# Patient Record
Sex: Male | Born: 1995 | Hispanic: No | Marital: Single | Smoking: Current some day smoker
Health system: Southern US, Community
[De-identification: ages and names within clinical notes are randomized; demographics above are authoritative.]

## PROBLEM LIST (undated history)

## (undated) DIAGNOSIS — W3400XA Accidental discharge from unspecified firearms or gun, initial encounter: Secondary | ICD-10-CM

## (undated) DIAGNOSIS — K219 Gastro-esophageal reflux disease without esophagitis: Secondary | ICD-10-CM

## (undated) DIAGNOSIS — F988 Other specified behavioral and emotional disorders with onset usually occurring in childhood and adolescence: Secondary | ICD-10-CM

## (undated) DIAGNOSIS — F909 Attention-deficit hyperactivity disorder, unspecified type: Secondary | ICD-10-CM

## (undated) DIAGNOSIS — F329 Major depressive disorder, single episode, unspecified: Secondary | ICD-10-CM

## (undated) DIAGNOSIS — F32A Depression, unspecified: Secondary | ICD-10-CM

## (undated) DIAGNOSIS — F419 Anxiety disorder, unspecified: Secondary | ICD-10-CM

## (undated) HISTORY — DX: Depression, unspecified: F32.A

## (undated) HISTORY — PX: COLOSTOMY: SHX63

## (undated) HISTORY — DX: Attention-deficit hyperactivity disorder, unspecified type: F90.9

## (undated) HISTORY — DX: Anxiety disorder, unspecified: F41.9

## (undated) HISTORY — DX: Major depressive disorder, single episode, unspecified: F32.9

---
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
## 2015-04-18 ENCOUNTER — Ambulatory Visit (HOSPITAL_COMMUNITY): Payer: Self-pay | Admitting: Psychiatry

## 2015-05-11 ENCOUNTER — Ambulatory Visit (INDEPENDENT_AMBULATORY_CARE_PROVIDER_SITE_OTHER): Payer: BLUE CROSS/BLUE SHIELD | Admitting: Psychiatry

## 2015-05-11 ENCOUNTER — Encounter (INDEPENDENT_AMBULATORY_CARE_PROVIDER_SITE_OTHER): Payer: Self-pay

## 2015-05-11 ENCOUNTER — Encounter (HOSPITAL_COMMUNITY): Payer: Self-pay | Admitting: Psychiatry

## 2015-05-11 VITALS — BP 120/78 | HR 93 | Ht 72.0 in | Wt 154.2 lb

## 2015-05-11 DIAGNOSIS — F331 Major depressive disorder, recurrent, moderate: Secondary | ICD-10-CM | POA: Diagnosis not present

## 2015-05-11 DIAGNOSIS — F909 Attention-deficit hyperactivity disorder, unspecified type: Secondary | ICD-10-CM | POA: Insufficient documentation

## 2015-05-11 DIAGNOSIS — F411 Generalized anxiety disorder: Secondary | ICD-10-CM | POA: Diagnosis not present

## 2015-05-11 DIAGNOSIS — F900 Attention-deficit hyperactivity disorder, predominantly inattentive type: Secondary | ICD-10-CM | POA: Diagnosis not present

## 2015-05-11 MED ORDER — AMPHETAMINE-DEXTROAMPHETAMINE 10 MG PO TABS: 10.0000 mg | ORAL_TABLET | Freq: Two times a day (BID) | ORAL | Status: DC
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
## 2015-05-25 ENCOUNTER — Encounter (HOSPITAL_COMMUNITY): Payer: Self-pay | Admitting: Psychiatry

## 2015-05-25 ENCOUNTER — Ambulatory Visit (INDEPENDENT_AMBULATORY_CARE_PROVIDER_SITE_OTHER): Payer: BLUE CROSS/BLUE SHIELD | Admitting: Psychiatry

## 2015-05-25 DIAGNOSIS — F900 Attention-deficit hyperactivity disorder, predominantly inattentive type: Secondary | ICD-10-CM | POA: Diagnosis not present

## 2015-05-25 MED ORDER — AMPHETAMINE-DEXTROAMPHET ER 20 MG PO CP24: 20.0000 mg | ORAL_CAPSULE | Freq: Two times a day (BID) | ORAL | Status: DC

## 2015-05-25 NOTE — Progress Notes (Signed)
BH MD/PA/NP OP Progress Note  05/25/2015 1:42 PM Nathan Vincent  MRN:  [id number]  Subjective:  Being treated for ADHD trial with depression and anxiety Chief Complaint: depression, anxiety and inability to focus Visit Diagnosis:  ADHD inattentive  Past Medical History: No past medical history on file. No past surgical history on file. Family History: No family history on file. Social History:  Social History   Social History  . Marital Status: Single    Spouse Name: N/A  . Number of Children: N/A  . Years of Education: N/A   Social History Main Topics  . Smoking status: Current Every Day Smoker -- 0.25 packs/day for 6 years    Types: Cigarettes  . Smokeless tobacco: Never Used  . Alcohol Use: 0.0 oz/week    0 Standard drinks or equivalent per week     Comment: OCC  . Drug Use: Yes    Special: Marijuana  . Sexual Activity: Not Asked   Other Topics Concern  . None   Social History Narrative   Additional History: none  Assessment:Nathan Vincent says the 10 mg Adderall was very helpful for the first 2 weeks and not as noticeably helpful for the last 2.  He has been able to focus, organize, complete tasks, hold his thoughts while somebody else is speaking and be able to recall what he wanted to say.  Has not felt depressed and anxiety has greatly decreased.  He was not presenting as ADHD last visit, that was my thinking so he is very surprised by the change.  Says he feels motivated and people tell him he is easier to talk with.  Says he stopped drinking because he does not need the wine to help sleep and stop his racing thoughts.  Musculoskeletal: Strength & Muscle Tone: within normal limits Gait & Station: normal Patient leans: N/A  Psychiatric Specialty Exam: HPI  ROS  There were no vitals taken for this visit.There is no weight on file to calculate BMI.  General Appearance: Well Groomed  Eye Contact:  Good  Speech:  Clear and Coherent  Volume:  Normal  Mood:   Euthymic  Affect:  Congruent  Thought Process:  Coherent and Logical  Orientation:  Full (Time, Place, and Person)  Thought Content:  Negative  Suicidal Thoughts:  No  Homicidal Thoughts:  No  Memory:  Immediate;   Good Recent;   Good Remote;   Good  Judgement:  Good  Insight:  Good  Psychomotor Activity:  Normal  Concentration:  Good  Recall:  Good  Fund of Knowledge: Good  Language: Good  Akathisia:  Negative  Handed:  Right  AIMS (if indicated):  0  Assets:  Communication Skills Desire for Improvement Financial Resources/Insurance Housing Intimacy Leisure Time Physical Health Resilience Social Support Talents/Skills Transportation Vocational/Educational  ADL's:  Intact  Cognition: WNL  Sleep:  adequate   Is the patient at risk to self?  No. Has the patient been a risk to self in the past 6 months?  No. Has the patient been a risk to self within the distant past?  No. Is the patient a risk to others?  No. Has the patient been a risk to others in the past 6 months?  No. Has the patient been a risk to others within the distant past?  No.  Current Medications: Current Outpatient Prescriptions  Medication Sig Dispense Refill  . amphetamine-dextroamphetamine (ADDERALL XR) 20 MG 24 hr capsule Take 1 capsule (20 mg total) by mouth 2 (  two) times daily. 60 capsule 0  . amphetamine-dextroamphetamine (ADDERALL XR) 20 MG 24 hr capsule Take 1 capsule (20 mg total) by mouth 2 (two) times daily. 60 capsule 0  . amphetamine-dextroamphetamine (ADDERALL XR) 20 MG 24 hr capsule Take 1 capsule (20 mg total) by mouth 2 (two) times daily. 60 capsule 0   No current facility-administered medications for this visit.    Medical Decision Making:  Established Problem, Stable/Improving (1)  Treatment Plan Summary:Medication management and Plan increase Adderall to 20 mg bid.  return to clinic in 3 months   Donnelly Angelica 05/25/2015, 1:42 PM

## 2015-06-08 ENCOUNTER — Telehealth (HOSPITAL_COMMUNITY): Payer: Self-pay

## 2015-06-08 NOTE — Telephone Encounter (Signed)
Medication management - Fax received from [facility] for approval of patient's Adderall XR medication.  Case # [id number] approved from 06/08/15-06/07/16.

## 2015-06-08 NOTE — Telephone Encounter (Signed)
Patients father called me, the adderall needed a prior auth, I called the insurance and was able to get it done. I spoke with Georgann Housekeeper and the [place] # is [id number] good until 06/07/2016. I called patients father and let him know that they should be able to fill the rx now

## 2015-08-24 ENCOUNTER — Ambulatory Visit (INDEPENDENT_AMBULATORY_CARE_PROVIDER_SITE_OTHER): Payer: BLUE CROSS/BLUE SHIELD | Admitting: Psychiatry

## 2015-08-24 ENCOUNTER — Encounter (HOSPITAL_COMMUNITY): Payer: Self-pay | Admitting: Psychiatry

## 2015-08-24 VITALS — BP 90/64 | HR 94 | Ht 71.75 in | Wt 140.8 lb

## 2015-08-24 DIAGNOSIS — F331 Major depressive disorder, recurrent, moderate: Secondary | ICD-10-CM | POA: Diagnosis not present

## 2015-08-24 DIAGNOSIS — F900 Attention-deficit hyperactivity disorder, predominantly inattentive type: Secondary | ICD-10-CM

## 2015-08-24 DIAGNOSIS — F411 Generalized anxiety disorder: Secondary | ICD-10-CM | POA: Diagnosis not present

## 2015-08-24 MED ORDER — PAROXETINE HCL 20 MG PO TABS: 20.0000 mg | ORAL_TABLET | Freq: Every day | ORAL | Status: DC

## 2015-08-24 MED ORDER — AMPHETAMINE-DEXTROAMPHET ER 20 MG PO CP24: 20.0000 mg | ORAL_CAPSULE | Freq: Two times a day (BID) | ORAL | Status: DC
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
## 2015-09-28 ENCOUNTER — Encounter (HOSPITAL_COMMUNITY): Payer: Self-pay | Admitting: Emergency Medicine

## 2015-09-28 ENCOUNTER — Emergency Department (HOSPITAL_COMMUNITY)
Admission: EM | Admit: 2015-09-28 | Discharge: 2015-09-29 | Disposition: A | Discharge status: Home / Self Care | Payer: BLUE CROSS/BLUE SHIELD | Attending: Emergency Medicine | Admitting: Emergency Medicine

## 2015-09-28 DIAGNOSIS — F331 Major depressive disorder, recurrent, moderate: Secondary | ICD-10-CM | POA: Insufficient documentation

## 2015-09-28 DIAGNOSIS — Z79899 Other long term (current) drug therapy: Secondary | ICD-10-CM | POA: Insufficient documentation

## 2015-09-28 DIAGNOSIS — F909 Attention-deficit hyperactivity disorder, unspecified type: Secondary | ICD-10-CM | POA: Insufficient documentation

## 2015-09-28 DIAGNOSIS — R451 Restlessness and agitation: Secondary | ICD-10-CM | POA: Diagnosis present

## 2015-09-28 DIAGNOSIS — F17210 Nicotine dependence, cigarettes, uncomplicated: Secondary | ICD-10-CM | POA: Insufficient documentation

## 2015-09-28 LAB — COMPREHENSIVE METABOLIC PANEL
ALBUMIN: 5.2 g/dL — AB (ref 3.5–5.0)
ALK PHOS: 84 U/L (ref 38–126)
ALT: 18 U/L (ref 17–63)
AST: 27 U/L (ref 15–41)
Anion gap: 8 (ref 5–15)
BILIRUBIN TOTAL: 1 mg/dL (ref 0.3–1.2)
BUN: 8 mg/dL (ref 6–20)
CALCIUM: 9.6 mg/dL (ref 8.9–10.3)
CO2: 27 mmol/L (ref 22–32)
CREATININE: 0.93 mg/dL (ref 0.61–1.24)
Chloride: 102 mmol/L (ref 101–111)
GFR calc Af Amer: >60 mL/min (ref >60)
GFR calc non Af Amer: >60 mL/min (ref >60)
GLUCOSE: 99 mg/dL (ref 65–99)
Potassium: 3 mmol/L — ABNORMAL LOW (ref 3.5–5.1)
SODIUM: 137 mmol/L (ref 135–145)
TOTAL PROTEIN: 8.1 g/dL (ref 6.5–8.1)

## 2015-09-28 LAB — CBC
HEMATOCRIT: 45.1 % (ref 39.0–52.0)
HEMOGLOBIN: 15.6 g/dL (ref 13.0–17.0)
MCH: 28.6 pg (ref 26.0–34.0)
MCHC: 34.6 g/dL (ref 30.0–36.0)
MCV: 82.8 fL (ref 78.0–100.0)
Platelets: 327 10*3/uL (ref 150–400)
RBC: 5.45 MIL/uL (ref 4.22–5.81)
RDW: 13.3 % (ref 11.5–15.5)
WBC: 14 10*3/uL — ABNORMAL HIGH (ref 4.0–10.5)

## 2015-09-28 LAB — RAPID URINE DRUG SCREEN, HOSP PERFORMED
Amphetamines: NOT DETECTED
BARBITURATES: NOT DETECTED
Benzodiazepines: NOT DETECTED
COCAINE: NOT DETECTED
Opiates: NOT DETECTED
TETRAHYDROCANNABINOL: NOT DETECTED

## 2015-09-28 LAB — ACETAMINOPHEN LEVEL

## 2015-09-28 LAB — ETHANOL: Alcohol, Ethyl (B): <5 mg/dL (ref <5)

## 2015-09-28 LAB — SALICYLATE LEVEL: Salicylate Lvl: <4 mg/dL (ref 2.8–30.0)

## 2015-09-28 MED ORDER — PAROXETINE HCL 20 MG PO TABS
20.0000 mg | ORAL_TABLET | Freq: Every day | ORAL | Status: DC
Start: 2015-09-29 — End: 2015-09-29

## 2015-09-28 MED ORDER — AMPHETAMINE-DEXTROAMPHET ER 10 MG PO CP24
20.0000 mg | ORAL_CAPSULE | Freq: Two times a day (BID) | ORAL | Status: DC
  Administered 2015-09-28: 20 mg via ORAL
  Filled 2015-09-28: qty 2

## 2015-09-28 NOTE — BH Assessment (Signed)
Assessment completed. Consulted with Darlyne Russian, PA-C who states that patient does not meet criteria at this time. Will speak with EDP regarding releasing patient from IVC or patient will be observed overnight per Darlyne Russian, PA-C.   Rosalin Hawking, LCSW Therapeutic Triage Specialist [place] 09/28/2015 11:16 PM
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
## 2015-09-28 NOTE — BH Assessment (Signed)
Spoke with patient regarding discharge. Advised patient that it was recommended that he follow up with Dr. Doyne Keel tomorrow. Advised patient to call [phone] to request a hospital follow up appointment. Patient reports that he has a safe place to go to tonight and will call Dr. Havery Moros office tomorrow to see if he can move his appointment up.   Rosalin Hawking, LCSW Therapeutic Triage Specialist [place] 09/28/2015 11:59 PM

## 2015-09-28 NOTE — Discharge Instructions (Signed)
Major Depressive Disorder Major depressive disorder is a mental illness. It also may be called clinical depression or unipolar depression. Major depressive disorder usually causes feelings of sadness, hopelessness, or helplessness. Some people with this disorder do not feel particularly sad but lose interest in doing things they used to enjoy (anhedonia). Major depressive disorder also can cause physical symptoms. It can interfere with work, school, relationships, and other normal everyday activities. The disorder varies in severity but is longer lasting and more serious than the sadness we all feel from time to time in our lives. Major depressive disorder often is triggered by stressful life events or major life changes. Examples of these triggers include divorce, loss of your job or home, a move, and the death of a family member or close friend. Sometimes this disorder occurs for no obvious reason at all. People who have family members with major depressive disorder or bipolar disorder are at higher risk for developing this disorder, with or without life stressors. Major depressive disorder can occur at any age. It may occur just once in your life (single episode major depressive disorder). It may occur multiple times (recurrent major depressive disorder). SYMPTOMS People with major depressive disorder have either anhedonia or depressed mood on nearly a daily basis for at least 2 weeks or longer. Symptoms of depressed mood include:  Feelings of sadness (blue or down in the dumps) or emptiness.  Feelings of hopelessness or helplessness.  Tearfulness or episodes of crying (may be observed by others).  Irritability (children and adolescents). In addition to depressed mood or anhedonia or both, people with this disorder have at least four of the following symptoms:  Difficulty sleeping or sleeping too much.   Significant change (increase or decrease) in appetite or weight.   Lack of energy or  motivation.  Feelings of guilt and worthlessness.   Difficulty concentrating, remembering, or making decisions.  Unusually slow movement (psychomotor retardation) or restlessness (as observed by others).   Recurrent wishes for death, recurrent thoughts of self-harm (suicide), or a suicide attempt. People with major depressive disorder commonly have persistent negative thoughts about themselves, other people, and the world. People with severe major depressive disorder may experiencedistorted beliefs or perceptions about the world (psychotic delusions). They also may see or hear things that are not real (psychotic hallucinations). DIAGNOSIS Major depressive disorder is diagnosed through an assessment by your health care provider. Your health care provider will ask aboutaspects of your daily life, such as mood,sleep, and appetite, to see if you have the diagnostic symptoms of major depressive disorder. Your health care provider may ask about your medical history and use of alcohol or drugs, including prescription medicines. Your health care provider also may do a physical exam and blood work. This is because certain medical conditions and the use of certain substances can cause major depressive disorder-like symptoms (secondary depression). Your health care provider also may refer you to a mental health specialist for further evaluation and treatment. TREATMENT It is important to recognize the symptoms of major depressive disorder and seek treatment. The following treatments can be prescribed for this disorder:   Medicine. Antidepressant medicines usually are prescribed. Antidepressant medicines are thought to correct chemical imbalances in the brain that are commonly associated with major depressive disorder. Other types of medicine may be added if the symptoms do not respond to antidepressant medicines alone or if psychotic delusions or hallucinations occur.  Talk therapy. Talk therapy can be  helpful in treating major depressive disorder by providing   support, education, and guidance. Certain types of talk therapy also can help with negative thinking (cognitive behavioral therapy) and with relationship issues that trigger this disorder (interpersonal therapy). A mental health specialist can help determine which treatment is best for you. Most people with major depressive disorder do well with a combination of medicine and talk therapy. Treatments involving electrical stimulation of the brain can be used in situations with extremely severe symptoms or when medicine and talk therapy do not work over time. These treatments include electroconvulsive therapy, transcranial magnetic stimulation, and vagal nerve stimulation.   This information is not intended to replace advice given to you by your health care provider. Make sure you discuss any questions you have with your health care provider.   Document Released: 08/17/2012 Document Revised: 05/13/2014 Document Reviewed: 08/17/2012 Elsevier Interactive Patient Education 2016 [employer]  

## 2015-09-28 NOTE — ED Notes (Signed)
Bed: [facility] Expected date: 09/28/15 Expected time: 11:10 PM Means of arrival:  Comments: [facility] C
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
## 2015-10-01 ENCOUNTER — Encounter (HOSPITAL_COMMUNITY): Payer: Self-pay | Admitting: Emergency Medicine

## 2015-10-01 ENCOUNTER — Emergency Department (HOSPITAL_COMMUNITY)
Admission: EM | Admit: 2015-10-01 | Discharge: 2015-10-01 | Disposition: A | Discharge status: Home / Self Care | Payer: BLUE CROSS/BLUE SHIELD | Attending: Emergency Medicine | Admitting: Emergency Medicine

## 2015-10-01 DIAGNOSIS — R451 Restlessness and agitation: Secondary | ICD-10-CM | POA: Diagnosis not present

## 2015-10-01 DIAGNOSIS — F17210 Nicotine dependence, cigarettes, uncomplicated: Secondary | ICD-10-CM | POA: Diagnosis not present

## 2015-10-01 DIAGNOSIS — F909 Attention-deficit hyperactivity disorder, unspecified type: Secondary | ICD-10-CM | POA: Insufficient documentation

## 2015-10-01 DIAGNOSIS — F329 Major depressive disorder, single episode, unspecified: Secondary | ICD-10-CM | POA: Diagnosis present

## 2015-10-01 MED ORDER — LORAZEPAM 0.5 MG PO TABS: 0.5000 mg | ORAL_TABLET | Freq: Three times a day (TID) | ORAL | Status: DC | PRN

## 2015-10-01 NOTE — Discharge Instructions (Signed)
Please read and follow all provided instructions.  Your diagnoses today include:  1. Agitation     Tests performed today include:  Vital signs. See below for your results today.   Medications prescribed:   Ativan - medication for relaxation  DO NOT drive or perform any activities that require you to be awake and alert because this medicine can make you drowsy.   Home care instructions:  Follow any educational materials contained in this packet.  Follow-up instructions: Please follow-up with your psychiatrist in 2 days.  Return instructions:   Please return to the Emergency Department if you experience worsening symptoms.   Please return if you have any other emergent concerns.  Additional Information:  Your vital signs today were: BP 129/75 mmHg   Pulse 107   Temp(Src) 98 F (36.7 C) (Oral)   Resp 18   SpO2 100% If your blood pressure (BP) was elevated above 135/85 this visit, please have this repeated by your doctor within one month. ---------------

## 2015-10-01 NOTE — ED Notes (Signed)
Patient was alert, oriented and stable upon discharge. RN went over AVS and patient had no further questions.  

## 2015-10-01 NOTE — ED Provider Notes (Signed)
CSN: [id number]     Arrival date & time 10/01/15  2005 History   First MD Initiated Contact with Patient 10/01/15 2102     Chief Complaint  Patient presents with  . Depression     (Consider location/radiation/quality/duration/timing/severity/associated sxs/prior Treatment) HPI Comments: Patient with history of ADHD, anxiety, depression -- presents with complaint of agitation. Patient states he has had episodes of agitation over the past week. He states that he is currently on Adderall and Paxil. He started the Paxil approximately one month ago. Patient states that he ran out 6 days ago and after that began having agitation spells. He feels that he would be better off on just the Adderall and is requesting medication change. Patient denies hallucinations, suicidal ideation, homicidal ideation. He sees Dr. Doyne Keel at [facility] and has follow-up in 1 month. He has not attempted to contact behavioral health regarding his concerns.  The history is provided by the patient.    Past Medical History  Diagnosis Date  . ADHD (attention deficit hyperactivity disorder)   . Anxiety   . Depression    Past Surgical History  Procedure Laterality Date  . No past surgeries     Family History  Problem Relation Age of Onset  . Family history unknown: Yes   Social History  Substance Use Topics  . Smoking status: Current Every Day Smoker -- 0.25 packs/day for 6 years    Types: Cigarettes  . Smokeless tobacco: Never Used  . Alcohol Use: 0.0 oz/week    0 Standard drinks or equivalent per week     Comment: 2 beers a month    Review of Systems  Constitutional: Negative for fever.  HENT: Negative for rhinorrhea and sore throat.   Eyes: Negative for redness.  Respiratory: Negative for cough.   Cardiovascular: Negative for chest pain.  Gastrointestinal: Negative for nausea, vomiting, abdominal pain and diarrhea.  Genitourinary: Negative for dysuria.  Musculoskeletal: Negative for myalgias.  Skin:  Negative for rash.  Neurological: Negative for headaches.  Psychiatric/Behavioral: Positive for agitation. Negative for suicidal ideas and hallucinations. The patient is nervous/anxious.       Allergies  Review of patient's allergies indicates no known allergies.  Home Medications   Prior to Admission medications   Medication Sig Start Date End Date Taking? Authorizing Provider  amphetamine-dextroamphetamine (ADDERALL XR) 20 MG 24 hr capsule Take 1 capsule (20 mg total) by mouth 2 (two) times daily. 08/24/15 08/23/16  Charlcie Cradle, MD  PARoxetine (PAXIL) 20 MG tablet Take 1 tablet (20 mg total) by mouth daily. 08/24/15 08/23/16  Charlcie Cradle, MD   BP 129/75 mmHg  Pulse 107  Temp(Src) 98 F (36.7 C) (Oral)  Resp 18  SpO2 100% Physical Exam  Constitutional: He appears well-developed and well-nourished.  HENT:  Head: Normocephalic and atraumatic.  Eyes: Conjunctivae are normal.  Neck: Normal range of motion. Neck supple.  Pulmonary/Chest: No respiratory distress.  Neurological: He is alert.  Skin: Skin is warm and dry.  Psychiatric: He has a normal mood and affect. His behavior is normal. Thought content normal. He expresses no homicidal and no suicidal ideation.  Nursing note and vitals reviewed.   ED Course  Procedures (including critical care time)  10:19 PM Patient seen and examined.    Vital signs reviewed and are as follows: BP 129/75 mmHg  Pulse 107  Temp(Src) 98 F (36.7 C) (Oral)  Resp 18  SpO2 100%  Discussed with patient that I do not feel comfortable making adjustments to his  psychiatric medications. I encouraged him to follow-up with his psychiatrist. His increased agitation may be due to abrupt discontinuation of Paxil since he ran out 6 days ago. I provided #5 0.5mg  Ativan to use in case of increased agitation. Patient is to contact his psychiatrist to see if he can get follow-up sooner. Patient agrees with plan.  MDM   Final diagnoses:  Agitation    No SI, HI, hallucinations. No indication for TTS consult at this time.    Carlisle Cater, PA-C 10/01/15 2307  Leonard Schwartz, MD 10/13/15 0900

## 2015-10-01 NOTE — ED Notes (Signed)
PA at bedside.

## 2015-10-01 NOTE — ED Notes (Signed)
Pt states that he wants to get off his paxil and be switched back to his adderall. States that the Paxil has made him aggressive and he cannot come back to his parent's house until he is calm/stable. States that he drank alcohol with the paxil before and had an adverse reaction. Denies SI/HI. Alert and oriented.

## 2015-11-02 ENCOUNTER — Encounter (HOSPITAL_COMMUNITY): Payer: Self-pay | Admitting: Psychiatry

## 2015-11-02 ENCOUNTER — Ambulatory Visit (INDEPENDENT_AMBULATORY_CARE_PROVIDER_SITE_OTHER): Payer: BLUE CROSS/BLUE SHIELD | Admitting: Psychiatry

## 2015-11-02 ENCOUNTER — Telehealth (HOSPITAL_COMMUNITY): Payer: Self-pay

## 2015-11-02 VITALS — BP 120/68 | HR 97 | Ht 72.0 in | Wt 142.0 lb

## 2015-11-02 DIAGNOSIS — F331 Major depressive disorder, recurrent, moderate: Secondary | ICD-10-CM

## 2015-11-02 DIAGNOSIS — F900 Attention-deficit hyperactivity disorder, predominantly inattentive type: Secondary | ICD-10-CM

## 2015-11-02 DIAGNOSIS — F411 Generalized anxiety disorder: Secondary | ICD-10-CM | POA: Diagnosis not present

## 2015-11-02 MED ORDER — AMPHETAMINE-DEXTROAMPHET ER 20 MG PO CP24: 20.0000 mg | ORAL_CAPSULE | Freq: Two times a day (BID) | ORAL | Status: DC

## 2015-11-02 MED ORDER — BUSPIRONE HCL 5 MG PO TABS: 5.0000 mg | ORAL_TABLET | Freq: Two times a day (BID) | ORAL | Status: DC
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
## 2015-11-03 ENCOUNTER — Telehealth (HOSPITAL_COMMUNITY): Payer: Self-pay

## 2015-11-03 NOTE — Telephone Encounter (Signed)
11/03/15  12noon Pt's father came by and pick-up rx script and AVS..Mariana Kaufman

## 2015-12-07 ENCOUNTER — Telehealth (HOSPITAL_COMMUNITY): Payer: Self-pay

## 2015-12-07 ENCOUNTER — Other Ambulatory Visit (HOSPITAL_COMMUNITY): Payer: Self-pay | Admitting: Psychiatry

## 2015-12-07 DIAGNOSIS — F900 Attention-deficit hyperactivity disorder, predominantly inattentive type: Secondary | ICD-10-CM

## 2015-12-07 MED ORDER — AMPHETAMINE-DEXTROAMPHET ER 20 MG PO CP24: 20.0000 mg | ORAL_CAPSULE | Freq: Two times a day (BID) | ORAL | 0 refills | Status: DC

## 2015-12-07 NOTE — Telephone Encounter (Signed)
Patients father calling, patient needs a refill on his Adderall. Patient was here last month and only got one month supply. His follow up is in September, okay to refill for two months? Please review and advise, thank you

## 2016-01-06 ENCOUNTER — Other Ambulatory Visit (HOSPITAL_COMMUNITY): Payer: Self-pay | Admitting: Psychiatry

## 2016-01-06 DIAGNOSIS — F411 Generalized anxiety disorder: Secondary | ICD-10-CM

## 2016-01-11 ENCOUNTER — Ambulatory Visit (HOSPITAL_COMMUNITY): Payer: Self-pay | Admitting: Psychiatry

## 2016-01-11 ENCOUNTER — Other Ambulatory Visit (HOSPITAL_COMMUNITY): Payer: Self-pay

## 2016-01-11 ENCOUNTER — Other Ambulatory Visit (HOSPITAL_COMMUNITY): Payer: Self-pay | Admitting: Psychiatry

## 2016-01-11 DIAGNOSIS — F411 Generalized anxiety disorder: Secondary | ICD-10-CM

## 2016-01-11 DIAGNOSIS — F900 Attention-deficit hyperactivity disorder, predominantly inattentive type: Secondary | ICD-10-CM

## 2016-01-11 MED ORDER — BUSPIRONE HCL 5 MG PO TABS: 5.0000 mg | ORAL_TABLET | Freq: Two times a day (BID) | ORAL | 1 refills | Status: DC

## 2016-01-11 MED ORDER — AMPHETAMINE-DEXTROAMPHET ER 20 MG PO CP24: 20.0000 mg | ORAL_CAPSULE | Freq: Two times a day (BID) | ORAL | 0 refills | Status: DC

## 2016-01-11 NOTE — Progress Notes (Signed)
Patient missed appointment, came in for refills. Dr. Doyne Keel agreed to refill medication until his rescheduled follow up

## 2016-01-25 ENCOUNTER — Ambulatory Visit (INDEPENDENT_AMBULATORY_CARE_PROVIDER_SITE_OTHER): Payer: BLUE CROSS/BLUE SHIELD | Admitting: Psychiatry

## 2016-01-25 ENCOUNTER — Encounter (HOSPITAL_COMMUNITY): Payer: Self-pay | Admitting: Psychiatry

## 2016-01-25 VITALS — BP 118/78 | HR 94 | Ht 72.0 in | Wt 147.6 lb

## 2016-01-25 DIAGNOSIS — F331 Major depressive disorder, recurrent, moderate: Secondary | ICD-10-CM

## 2016-01-25 DIAGNOSIS — F900 Attention-deficit hyperactivity disorder, predominantly inattentive type: Secondary | ICD-10-CM

## 2016-01-25 DIAGNOSIS — F411 Generalized anxiety disorder: Secondary | ICD-10-CM | POA: Diagnosis not present

## 2016-01-25 MED ORDER — AMPHETAMINE-DEXTROAMPHET ER 20 MG PO CP24: 20.0000 mg | ORAL_CAPSULE | Freq: Two times a day (BID) | ORAL | 0 refills | Status: DC

## 2016-01-25 MED ORDER — SERTRALINE HCL 50 MG PO TABS: 50.0000 mg | ORAL_TABLET | Freq: Every day | ORAL | 3 refills | Status: DC
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
## 2016-05-09 ENCOUNTER — Ambulatory Visit (HOSPITAL_COMMUNITY): Payer: Self-pay | Admitting: Psychiatry

## 2016-05-13 ENCOUNTER — Other Ambulatory Visit (HOSPITAL_COMMUNITY): Payer: Self-pay

## 2016-05-13 DIAGNOSIS — F900 Attention-deficit hyperactivity disorder, predominantly inattentive type: Secondary | ICD-10-CM

## 2016-05-13 MED ORDER — AMPHETAMINE-DEXTROAMPHET ER 20 MG PO CP24: 20.0000 mg | ORAL_CAPSULE | Freq: Two times a day (BID) | ORAL | 0 refills | Status: DC

## 2016-05-13 NOTE — Progress Notes (Signed)
Patient stopped in for Adderall prescription, he missed his appointment last week. Dr. Adele Schilder agreed to write enough to get him to his appointment, 05/16/2016

## 2016-05-15 ENCOUNTER — Telehealth (HOSPITAL_COMMUNITY): Payer: Self-pay | Admitting: Psychiatry

## 2016-05-15 NOTE — Telephone Encounter (Signed)
Nathan Vincent picked up prescription on [id number] lic [id number] dlo

## 2016-05-16 ENCOUNTER — Encounter (HOSPITAL_COMMUNITY): Payer: Self-pay | Admitting: Psychiatry

## 2016-05-16 ENCOUNTER — Ambulatory Visit (INDEPENDENT_AMBULATORY_CARE_PROVIDER_SITE_OTHER): Payer: BLUE CROSS/BLUE SHIELD | Admitting: Psychiatry

## 2016-05-16 DIAGNOSIS — F17210 Nicotine dependence, cigarettes, uncomplicated: Secondary | ICD-10-CM

## 2016-05-16 DIAGNOSIS — Z79899 Other long term (current) drug therapy: Secondary | ICD-10-CM | POA: Diagnosis not present

## 2016-05-16 DIAGNOSIS — F900 Attention-deficit hyperactivity disorder, predominantly inattentive type: Secondary | ICD-10-CM

## 2016-05-16 MED ORDER — AMPHETAMINE-DEXTROAMPHET ER 20 MG PO CP24: 20.0000 mg | ORAL_CAPSULE | Freq: Two times a day (BID) | ORAL | 0 refills | Status: DC
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
## 2016-06-07 ENCOUNTER — Emergency Department (HOSPITAL_COMMUNITY)
Admission: EM | Admit: 2016-06-07 | Discharge: 2016-06-07 | Disposition: A | Discharge status: Home / Self Care | Payer: BLUE CROSS/BLUE SHIELD | Attending: Emergency Medicine | Admitting: Emergency Medicine

## 2016-06-07 ENCOUNTER — Encounter (HOSPITAL_COMMUNITY): Payer: Self-pay | Admitting: Emergency Medicine

## 2016-06-07 DIAGNOSIS — F909 Attention-deficit hyperactivity disorder, unspecified type: Secondary | ICD-10-CM | POA: Insufficient documentation

## 2016-06-07 DIAGNOSIS — S51812A Laceration without foreign body of left forearm, initial encounter: Secondary | ICD-10-CM | POA: Insufficient documentation

## 2016-06-07 DIAGNOSIS — Z7289 Other problems related to lifestyle: Secondary | ICD-10-CM

## 2016-06-07 DIAGNOSIS — Y999 Unspecified external cause status: Secondary | ICD-10-CM | POA: Diagnosis not present

## 2016-06-07 DIAGNOSIS — X788XXA Intentional self-harm by other sharp object, initial encounter: Secondary | ICD-10-CM | POA: Insufficient documentation

## 2016-06-07 DIAGNOSIS — Y929 Unspecified place or not applicable: Secondary | ICD-10-CM | POA: Insufficient documentation

## 2016-06-07 DIAGNOSIS — Z79899 Other long term (current) drug therapy: Secondary | ICD-10-CM | POA: Diagnosis not present

## 2016-06-07 DIAGNOSIS — Y939 Activity, unspecified: Secondary | ICD-10-CM | POA: Diagnosis not present

## 2016-06-07 DIAGNOSIS — F17210 Nicotine dependence, cigarettes, uncomplicated: Secondary | ICD-10-CM | POA: Diagnosis not present

## 2016-06-07 DIAGNOSIS — Z23 Encounter for immunization: Secondary | ICD-10-CM | POA: Diagnosis not present

## 2016-06-07 MED ORDER — TETANUS-DIPHTH-ACELL PERTUSSIS 5-2.5-18.5 LF-MCG/0.5 IM SUSP
0.5000 mL | Freq: Once | INTRAMUSCULAR | Status: AC
  Administered 2016-06-07: 0.5 mL via INTRAMUSCULAR
  Filled 2016-06-07: qty 0.5

## 2016-06-07 MED ORDER — LIDOCAINE-EPINEPHRINE (PF) 2 %-1:200000 IJ SOLN
20.0000 mL | Freq: Once | INTRAMUSCULAR | Status: AC
  Administered 2016-06-07: 20 mL
  Filled 2016-06-07: qty 20

## 2016-06-07 NOTE — ED Provider Notes (Signed)
[facility] DEPT Provider Note   CSN: [id number] Arrival date & time: 06/07/16  0301   By signing my name below, I, Delton Prairie, attest that this documentation has been prepared under the direction and in the presence of Orpah Greek, MD  Electronically Signed: Delton Prairie, ED Scribe. 06/07/16. 3:16 AM.   History   Chief Complaint Chief Complaint  Patient presents with  . Laceration   The history is provided by the patient. No language interpreter was used.   HPI Comments:  Nathan Vincent is a 21 y.o. male who presents to the Emergency Department complaining of acute onset laceration to his left forearm which occurred PTA. Pt states he cuts himself as a coping mechanism. Tetanus status is unknown. No alleviating factors noted. Pt denis suicidal ideation. No other associated symptoms noted.   Past Medical History:  Diagnosis Date  . ADHD (attention deficit hyperactivity disorder)   . Anxiety   . Depression     Patient Active Problem List   Diagnosis Date Noted  . Attention deficit hyperactivity disorder (ADHD) 05/11/2015    Class: Chronic  . Depression, major, recurrent, moderate ([place]) 05/11/2015    Class: Chronic  . Generalized anxiety disorder 05/11/2015    Class: Chronic    Past Surgical History:  Procedure Laterality Date  . NO PAST SURGERIES         Home Medications    Prior to Admission medications   Medication Sig Start Date End Date Taking? Authorizing Provider  amphetamine-dextroamphetamine (ADDERALL XR) 20 MG 24 hr capsule Take 1 capsule (20 mg total) by mouth 2 (two) times daily. 05/16/16 05/16/17  Charlcie Cradle, MD  amphetamine-dextroamphetamine (ADDERALL XR) 20 MG 24 hr capsule Take 1 capsule (20 mg total) by mouth 2 (two) times daily. 05/16/16 05/16/17  Charlcie Cradle, MD  amphetamine-dextroamphetamine (ADDERALL XR) 20 MG 24 hr capsule Take 1 capsule (20 mg total) by mouth 2 (two) times daily. 05/16/16 05/16/17  Charlcie Cradle, MD     Family History Family History  Problem Relation Age of Onset  . Family history unknown: Yes    Social History Social History  Substance Use Topics  . Smoking status: Current Some Day Smoker    Packs/day: 0.50    Years: 6.00    Types: Cigarettes  . Smokeless tobacco: Never Used  . Alcohol use 0.0 oz/week     Comment: occassionally     Allergies   Patient has no known allergies.   Review of Systems Review of Systems  Skin: Positive for wound.  Psychiatric/Behavioral: Negative for suicidal ideas.  All other systems reviewed and are negative.  Physical Exam Updated Vital Signs BP 118/80   Pulse 88   Temp 98.1 F (36.7 C) (Oral)   Resp 19   SpO2 100%   Physical Exam  Constitutional: He is oriented to person, place, and time. He appears well-developed and well-nourished. No distress.  HENT:  Head: Normocephalic and atraumatic.  Right Ear: Hearing normal.  Left Ear: Hearing normal.  Nose: Nose normal.  Mouth/Throat: Oropharynx is clear and moist and mucous membranes are normal.  Eyes: Conjunctivae and EOM are normal. Pupils are equal, round, and reactive to light.  Neck: Normal range of motion. Neck supple.  Cardiovascular: Regular rhythm, S1 normal and S2 normal.  Exam reveals no gallop and no friction rub.   No murmur heard. Pulmonary/Chest: Effort normal and breath sounds normal. No respiratory distress. He exhibits no tenderness.  Abdominal: Soft. Normal appearance and bowel sounds are  normal. There is no hepatosplenomegaly. There is no tenderness. There is no rebound, no guarding, no tenderness at McBurney's point and negative Murphy's sign. No hernia.  Musculoskeletal: Normal range of motion.  Neurological: He is alert and oriented to person, place, and time. He has normal strength. No cranial nerve deficit or sensory deficit. Coordination normal. GCS eye subscore is 4. GCS verbal subscore is 5. GCS motor subscore is 6.  Skin: Skin is warm, dry and intact.  No rash noted. No cyanosis.  8 cm laceration to left forearm.  Psychiatric: He has a normal mood and affect. His speech is normal and behavior is normal. Thought content normal.  Nursing note and vitals reviewed.  ED Treatments / Results  DIAGNOSTIC STUDIES:  Oxygen Saturation is 100% on RA, normal by my interpretation.    COORDINATION OF CARE:  3:14 AM Discussed treatment plan with pt at bedside and pt agreed to plan.  Labs (all labs ordered are listed, but only abnormal results are displayed) Labs Reviewed - No data to display  EKG  EKG Interpretation None       Radiology No results found.  Procedures Procedures (including critical care time)  Medications Ordered in ED Medications  lidocaine-EPINEPHrine (XYLOCAINE W/EPI) 2 %-1:200000 (PF) injection 20 mL (not administered)  Tdap (BOOSTRIX) injection 0.5 mL (0.5 mLs Intramuscular Given 06/07/16 0335)     Initial Impression / Assessment and Plan / ED Course  I have reviewed the triage vital signs and the nursing notes.  Pertinent labs & imaging results that were available during my care of the patient were reviewed by me and considered in my medical decision making (see chart for details).     Patient presents to the emergency department for evaluation of laceration to left forearm. Patient reports a long history of cutting behavior which he uses as a "coping mechanism". Patient reports that he was told that the laceration on his arm was deep enough to require stitches. This occurred just prior to arrival. He adamantly denies suicide attempt. Patient's arms are heavily scarred with similarly arranged scars from previous cutting behavior.  Patient is able to contract for safety. Lacerations repaired, will be given resources for outpatient counseling.  Final Clinical Impressions(s) / ED Diagnoses   Final diagnoses:  Laceration of left forearm, initial encounter  Deliberate self-cutting    New Prescriptions New  Prescriptions   No medications on file  I personally performed the services described in this documentation, which was scribed in my presence. The recorded information has been reviewed and is accurate.     Orpah Greek, MD 06/07/16 [phone]

## 2016-06-07 NOTE — ED Provider Notes (Signed)
LACERATION REPAIR Performed by: Lorayne Bender Authorized by: Lorayne Bender Consent: Verbal consent obtained. Risks and benefits: risks, benefits and alternatives were discussed Consent given by: patient Patient identity confirmed: provided demographic data Prepped and Draped in normal sterile fashion Wound explored  Laceration Location: Left forearm  Laceration Length: 13 cm  No Foreign Bodies seen or palpated  Anesthesia: local infiltration  Local anesthetic: lidocaine 2% with epinephrine  Anesthetic total: 8 ml  Irrigation method: syringe Amount of cleaning: standard  Skin closure: 3-0 prolene  Number of sutures: 15 total  Technique: Simple interrupted (11), Horizontal mattress (4)  Patient tolerance: Patient tolerated the procedure well with no immediate complications.  This was a more complex wound repair requiring undermining due to tension on the wound.   Lorayne Bender, PA-C 06/07/16 [place], PA-C 06/07/16 [place], MD 06/08/16 [phone]

## 2016-06-07 NOTE — Discharge Instructions (Signed)
Your sutures will need to be removed in 7-10 days. Follow-up with your primary doctor or at [facility] urgent care for suture removal.

## 2016-06-07 NOTE — ED Triage Notes (Signed)
Pt presents to ER for laceration to LEFT forearm; bleeding controlled; other scars from previous lacerations noted; pt denies this as a suicide attempt but as a stress reliever

## 2016-06-07 NOTE — ED Notes (Signed)
Wound now bleeding; covered with abd pad

## 2016-06-16 ENCOUNTER — Emergency Department (HOSPITAL_COMMUNITY)
Admission: EM | Admit: 2016-06-16 | Discharge: 2016-06-16 | Disposition: A | Discharge status: Home / Self Care | Payer: BLUE CROSS/BLUE SHIELD | Attending: Emergency Medicine | Admitting: Emergency Medicine

## 2016-06-16 ENCOUNTER — Encounter (HOSPITAL_COMMUNITY): Payer: Self-pay | Admitting: Emergency Medicine

## 2016-06-16 DIAGNOSIS — F909 Attention-deficit hyperactivity disorder, unspecified type: Secondary | ICD-10-CM | POA: Insufficient documentation

## 2016-06-16 DIAGNOSIS — Z79899 Other long term (current) drug therapy: Secondary | ICD-10-CM | POA: Insufficient documentation

## 2016-06-16 DIAGNOSIS — Z4802 Encounter for removal of sutures: Secondary | ICD-10-CM | POA: Diagnosis not present

## 2016-06-16 NOTE — ED Provider Notes (Signed)
[facility] DEPT Provider Note   CSN: [id number] Arrival date & time: 06/16/16  1244  By signing my name below, I, Nathan Vincent, attest that this documentation has been prepared under the direction and in the presence of [employer], PA-C.  Electronically Signed: Julien Vincent, ED Scribe. 06/16/16. 1:33 PM.    History   Chief Complaint Chief Complaint  Patient presents with  . Suture / Staple Removal   The history is provided by the patient. No language interpreter was used.   HPI Comments: Nathan Vincent is a 21 y.o. male who presents to the Emergency Department presenting for a suture removal. Pt has 15 sutures placed to the left forearm on 06/07/16 after cutting himself as a "coping mechanism". There has been no redness, warmth, or drainage from the area. He denies SI or HI.  Past Medical History:  Diagnosis Date  . ADHD (attention deficit hyperactivity disorder)   . Anxiety   . Depression     Patient Active Problem List   Diagnosis Date Noted  . Attention deficit hyperactivity disorder (ADHD) 05/11/2015    Class: Chronic  . Depression, major, recurrent, moderate ([place]) 05/11/2015    Class: Chronic  . Generalized anxiety disorder 05/11/2015    Class: Chronic    Past Surgical History:  Procedure Laterality Date  . NO PAST SURGERIES         Home Medications    Prior to Admission medications   Medication Sig Start Date End Date Taking? Authorizing Provider  amphetamine-dextroamphetamine (ADDERALL XR) 20 MG 24 hr capsule Take 1 capsule (20 mg total) by mouth 2 (two) times daily. 05/16/16 05/16/17  Charlcie Cradle, MD  amphetamine-dextroamphetamine (ADDERALL XR) 20 MG 24 hr capsule Take 1 capsule (20 mg total) by mouth 2 (two) times daily. 05/16/16 05/16/17  Charlcie Cradle, MD  amphetamine-dextroamphetamine (ADDERALL XR) 20 MG 24 hr capsule Take 1 capsule (20 mg total) by mouth 2 (two) times daily. 05/16/16 05/16/17  Charlcie Cradle, MD    Family  History Family History  Problem Relation Age of Onset  . Family history unknown: Yes    Social History Social History  Substance Use Topics  . Smoking status: Current Some Day Smoker    Packs/day: 0.50    Years: 6.00    Types: Cigarettes  . Smokeless tobacco: Never Used  . Alcohol use 0.0 oz/week     Comment: occassionally     Allergies   Patient has no known allergies.   Review of Systems Review of Systems  A complete 10 system review of systems was obtained and all systems are negative except as noted in the HPI and PMH.   Physical Exam Updated Vital Signs BP 112/74 (BP Location: Right Arm)   Pulse 93   Temp 98.4 F (36.9 C) (Oral)   Resp 16   Wt 63.5 kg   SpO2 97%   BMI 18.99 kg/m   Physical Exam  Constitutional: He is oriented to person, place, and time. He appears well-developed and well-nourished.  HENT:  Head: Normocephalic.  Eyes: EOM are normal.  Neck: Normal range of motion.  Pulmonary/Chest: Effort normal.  Abdominal: He exhibits no distension.  Musculoskeletal: Normal range of motion.  Neurological: He is alert and oriented to person, place, and time.  Skin:  Well healing incision, no signs of infection.  Psychiatric: He has a normal mood and affect.  Nursing note and vitals reviewed.    ED Treatments / Results  DIAGNOSTIC STUDIES: Oxygen Saturation is 100% on  RA, normal by my interpretation.  COORDINATION OF CARE:  1:30 PM Discussed treatment plan with pt at bedside and pt agreed to plan.  Labs (all labs ordered are listed, but only abnormal results are displayed) Labs Reviewed - No data to display  EKG  EKG Interpretation None       Radiology No results found.  Procedures .Suture Removal Date/Time: 06/16/2016 1:18 PM Performed by: Penni Bombard, Mena Simonis Authorized by: Penni Bombard, Tiffanie Blassingame   Consent:    Consent obtained:  Verbal   Consent given by:  Patient Location:    Location:  Upper extremity   Upper extremity location:   Arm   Arm location:  L lower arm Procedure details:    Wound appearance:  No signs of infection   Number of sutures removed:  15 Post-procedure details:    Post-removal:  No dressing applied   Patient tolerance of procedure:  Tolerated well, no immediate complications    (including critical care time)  Medications Ordered in ED Medications - No data to display   Initial Impression / Assessment and Plan / ED Course  I have reviewed the triage vital signs and the nursing notes.  Pertinent labs & imaging results that were available during my care of the patient were reviewed by me and considered in my medical decision making (see chart for details).       Final Clinical Impressions(s) / ED Diagnoses   Final diagnoses:  Visit for suture removal   Labs: n/a  Imaging: n/a  Consults: n/a  Therapeutics: n/a  Discharge Meds: n/a  Assessment/Plan:Laceration appearing well healing without signs of infection. Return precautions given.   I personally performed the services described in this documentation, which was scribed in my presence. The recorded information has been reviewed and is accurate.  New Prescriptions Discharge Medication List as of 06/16/2016  1:42 PM       Okey Regal, PA-C 06/16/16 [place], MD 06/16/16 [phone]

## 2016-06-16 NOTE — Discharge Instructions (Signed)
Please read attached information. If you experience any new or worsening signs or symptoms please return to the emergency room for evaluation.  °
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
## 2016-06-22 ENCOUNTER — Emergency Department (HOSPITAL_COMMUNITY): Payer: BLUE CROSS/BLUE SHIELD

## 2016-06-22 ENCOUNTER — Encounter (HOSPITAL_COMMUNITY): Admission: EM | Disposition: A | Discharge status: Home Health | Payer: Self-pay | Source: Home / Self Care

## 2016-06-22 ENCOUNTER — Emergency Department (HOSPITAL_COMMUNITY): Payer: BLUE CROSS/BLUE SHIELD | Admitting: Anesthesiology

## 2016-06-22 ENCOUNTER — Inpatient Hospital Stay (HOSPITAL_COMMUNITY)
Admission: EM | Admit: 2016-06-22 | Discharge: 2016-07-01 | DRG: 957 | Disposition: A | Discharge status: Home Health | Payer: BLUE CROSS/BLUE SHIELD | Attending: General Surgery | Admitting: General Surgery

## 2016-06-22 ENCOUNTER — Encounter (HOSPITAL_COMMUNITY): Payer: Self-pay | Admitting: Emergency Medicine

## 2016-06-22 DIAGNOSIS — S36490A Other injury of duodenum, initial encounter: Secondary | ICD-10-CM | POA: Diagnosis present

## 2016-06-22 DIAGNOSIS — Y249XXA Unspecified firearm discharge, undetermined intent, initial encounter: Secondary | ICD-10-CM | POA: Diagnosis not present

## 2016-06-22 DIAGNOSIS — S3719XA Other injury of ureter, initial encounter: Secondary | ICD-10-CM | POA: Diagnosis not present

## 2016-06-22 DIAGNOSIS — S31040A Puncture wound with foreign body of lower back and pelvis without penetration into retroperitoneum, initial encounter: Secondary | ICD-10-CM | POA: Diagnosis not present

## 2016-06-22 DIAGNOSIS — S36513A Primary blast injury of sigmoid colon, initial encounter: Secondary | ICD-10-CM | POA: Diagnosis not present

## 2016-06-22 DIAGNOSIS — J90 Pleural effusion, not elsewhere classified: Secondary | ICD-10-CM | POA: Diagnosis not present

## 2016-06-22 DIAGNOSIS — S36593A Other injury of sigmoid colon, initial encounter: Secondary | ICD-10-CM | POA: Diagnosis not present

## 2016-06-22 DIAGNOSIS — S32029A Unspecified fracture of second lumbar vertebra, initial encounter for closed fracture: Secondary | ICD-10-CM | POA: Diagnosis present

## 2016-06-22 DIAGNOSIS — F329 Major depressive disorder, single episode, unspecified: Secondary | ICD-10-CM | POA: Diagnosis present

## 2016-06-22 DIAGNOSIS — J029 Acute pharyngitis, unspecified: Secondary | ICD-10-CM | POA: Diagnosis not present

## 2016-06-22 DIAGNOSIS — R058 Other specified cough: Secondary | ICD-10-CM

## 2016-06-22 DIAGNOSIS — S31000A Unspecified open wound of lower back and pelvis without penetration into retroperitoneum, initial encounter: Secondary | ICD-10-CM | POA: Diagnosis not present

## 2016-06-22 DIAGNOSIS — S36410A Primary blast injury of duodenum, initial encounter: Secondary | ICD-10-CM | POA: Diagnosis not present

## 2016-06-22 DIAGNOSIS — S34101A Unspecified injury to L1 level of lumbar spinal cord, initial encounter: Secondary | ICD-10-CM | POA: Diagnosis not present

## 2016-06-22 DIAGNOSIS — K661 Hemoperitoneum: Secondary | ICD-10-CM | POA: Diagnosis present

## 2016-06-22 DIAGNOSIS — S31639A Puncture wound without foreign body of abdominal wall, unspecified quadrant with penetration into peritoneal cavity, initial encounter: Principal | ICD-10-CM | POA: Diagnosis present

## 2016-06-22 DIAGNOSIS — F17210 Nicotine dependence, cigarettes, uncomplicated: Secondary | ICD-10-CM | POA: Diagnosis present

## 2016-06-22 DIAGNOSIS — S71132A Puncture wound without foreign body, left thigh, initial encounter: Secondary | ICD-10-CM | POA: Diagnosis present

## 2016-06-22 DIAGNOSIS — R05 Cough: Secondary | ICD-10-CM

## 2016-06-22 DIAGNOSIS — S71102A Unspecified open wound, left thigh, initial encounter: Secondary | ICD-10-CM | POA: Diagnosis not present

## 2016-06-22 DIAGNOSIS — Z4682 Encounter for fitting and adjustment of non-vascular catheter: Secondary | ICD-10-CM | POA: Diagnosis not present

## 2016-06-22 DIAGNOSIS — E876 Hypokalemia: Secondary | ICD-10-CM | POA: Diagnosis not present

## 2016-06-22 DIAGNOSIS — W3400XA Accidental discharge from unspecified firearms or gun, initial encounter: Secondary | ICD-10-CM

## 2016-06-22 DIAGNOSIS — Z4659 Encounter for fitting and adjustment of other gastrointestinal appliance and device: Secondary | ICD-10-CM

## 2016-06-22 DIAGNOSIS — K567 Ileus, unspecified: Secondary | ICD-10-CM | POA: Diagnosis not present

## 2016-06-22 DIAGNOSIS — S36119A Unspecified injury of liver, initial encounter: Secondary | ICD-10-CM | POA: Diagnosis not present

## 2016-06-22 DIAGNOSIS — D72829 Elevated white blood cell count, unspecified: Secondary | ICD-10-CM

## 2016-06-22 DIAGNOSIS — S36113A Laceration of liver, unspecified degree, initial encounter: Secondary | ICD-10-CM | POA: Diagnosis not present

## 2016-06-22 DIAGNOSIS — R109 Unspecified abdominal pain: Secondary | ICD-10-CM | POA: Diagnosis not present

## 2016-06-22 DIAGNOSIS — S299XXA Unspecified injury of thorax, initial encounter: Secondary | ICD-10-CM | POA: Diagnosis not present

## 2016-06-22 DIAGNOSIS — S31600A Unspecified open wound of abdominal wall, right upper quadrant with penetration into peritoneal cavity, initial encounter: Secondary | ICD-10-CM | POA: Diagnosis not present

## 2016-06-22 DIAGNOSIS — S27321A Contusion of lung, unilateral, initial encounter: Secondary | ICD-10-CM | POA: Diagnosis not present

## 2016-06-22 DIAGNOSIS — T148XXA Other injury of unspecified body region, initial encounter: Secondary | ICD-10-CM | POA: Diagnosis not present

## 2016-06-22 DIAGNOSIS — S2190XA Unspecified open wound of unspecified part of thorax, initial encounter: Secondary | ICD-10-CM | POA: Diagnosis not present

## 2016-06-22 DIAGNOSIS — S34102A Unspecified injury to L2 level of lumbar spinal cord, initial encounter: Secondary | ICD-10-CM | POA: Diagnosis not present

## 2016-06-22 DIAGNOSIS — S31109A Unspecified open wound of abdominal wall, unspecified quadrant without penetration into peritoneal cavity, initial encounter: Secondary | ICD-10-CM | POA: Diagnosis not present

## 2016-06-22 DIAGNOSIS — S36503A Unspecified injury of sigmoid colon, initial encounter: Secondary | ICD-10-CM | POA: Diagnosis not present

## 2016-06-22 DIAGNOSIS — S27329A Contusion of lung, unspecified, initial encounter: Secondary | ICD-10-CM

## 2016-06-22 HISTORY — DX: Other specified behavioral and emotional disorders with onset usually occurring in childhood and adolescence: F98.8

## 2016-06-22 HISTORY — PX: LAPAROTOMY: SHX154

## 2016-06-22 LAB — COMPREHENSIVE METABOLIC PANEL
ALT: 117 U/L — ABNORMAL HIGH (ref 17–63)
ANION GAP: 13 (ref 5–15)
AST: 175 U/L — ABNORMAL HIGH (ref 15–41)
Albumin: 4.2 g/dL (ref 3.5–5.0)
Alkaline Phosphatase: 66 U/L (ref 38–126)
BUN: 9 mg/dL (ref 6–20)
CHLORIDE: 104 mmol/L (ref 101–111)
CO2: 23 mmol/L (ref 22–32)
Calcium: 9.5 mg/dL (ref 8.9–10.3)
Creatinine, Ser: 1.15 mg/dL (ref 0.61–1.24)
Glucose, Bld: 143 mg/dL — ABNORMAL HIGH (ref 65–99)
POTASSIUM: 3.2 mmol/L — AB (ref 3.5–5.1)
Sodium: 140 mmol/L (ref 135–145)
Total Bilirubin: 0.6 mg/dL (ref 0.3–1.2)
Total Protein: 6.7 g/dL (ref 6.5–8.1)

## 2016-06-22 LAB — CBC
HEMATOCRIT: 42.5 % (ref 39.0–52.0)
HEMOGLOBIN: 14.4 g/dL (ref 13.0–17.0)
MCH: 29.9 pg (ref 26.0–34.0)
MCHC: 33.9 g/dL (ref 30.0–36.0)
MCV: 88.2 fL (ref 78.0–100.0)
PLATELETS: 281 10*3/uL (ref 150–400)
RBC: 4.82 MIL/uL (ref 4.22–5.81)
RDW: 13.1 % (ref 11.5–15.5)
WBC: 13.4 10*3/uL — AB (ref 4.0–10.5)

## 2016-06-22 LAB — CDS SEROLOGY

## 2016-06-22 LAB — I-STAT CHEM 8, ED
BUN: 11 mg/dL (ref 6–20)
CREATININE: 1.2 mg/dL (ref 0.61–1.24)
Calcium, Ion: 1.1 mmol/L — ABNORMAL LOW (ref 1.15–1.40)
Chloride: 103 mmol/L (ref 101–111)
GLUCOSE: 139 mg/dL — AB (ref 65–99)
HEMATOCRIT: 46 % (ref 39.0–52.0)
HEMOGLOBIN: 15.6 g/dL (ref 13.0–17.0)
Potassium: 3.1 mmol/L — ABNORMAL LOW (ref 3.5–5.1)
Sodium: 142 mmol/L (ref 135–145)
TCO2: 26 mmol/L (ref 0–100)

## 2016-06-22 LAB — PROTIME-INR
INR: 0.98
PROTHROMBIN TIME: 13 s (ref 11.4–15.2)

## 2016-06-22 LAB — I-STAT CG4 LACTIC ACID, ED: Lactic Acid, Venous: 2.66 mmol/L (ref 0.5–1.9)

## 2016-06-22 LAB — ETHANOL: Alcohol, Ethyl (B): 52 mg/dL — ABNORMAL HIGH (ref <5)

## 2016-06-22 SURGERY — LAPAROTOMY, EXPLORATORY
Anesthesia: General

## 2016-06-22 MED ORDER — HEMOSTATIC AGENTS (NO CHARGE) OPTIME
TOPICAL | Status: DC | PRN
  Administered 2016-06-22 (×2): 1 via TOPICAL

## 2016-06-22 MED ORDER — CEFAZOLIN SODIUM-DEXTROSE 2-3 GM-% IV SOLR
INTRAVENOUS | Status: DC | PRN
  Administered 2016-06-22: 1 g via INTRAVENOUS

## 2016-06-22 MED ORDER — MIDAZOLAM HCL 5 MG/5ML IJ SOLN
INTRAMUSCULAR | Status: DC | PRN
  Administered 2016-06-22 – 2016-06-23 (×2): 2 mg via INTRAVENOUS

## 2016-06-22 MED ORDER — FENTANYL CITRATE (PF) 100 MCG/2ML IJ SOLN
INTRAMUSCULAR | Status: AC
  Filled 2016-06-22: qty 4

## 2016-06-22 MED ORDER — PROPOFOL 10 MG/ML IV BOLUS
INTRAVENOUS | Status: DC | PRN
Start: 2016-06-22 — End: 2016-06-23
  Administered 2016-06-22: 160 mg via INTRAVENOUS

## 2016-06-22 MED ORDER — LIDOCAINE HCL (CARDIAC) 20 MG/ML IV SOLN
INTRAVENOUS | Status: DC | PRN
  Administered 2016-06-22: 80 mg via INTRAVENOUS

## 2016-06-22 MED ORDER — ROCURONIUM BROMIDE 100 MG/10ML IV SOLN
INTRAVENOUS | Status: DC | PRN
  Administered 2016-06-22 – 2016-06-23 (×3): 50 mg via INTRAVENOUS

## 2016-06-22 MED ORDER — MIDAZOLAM HCL 2 MG/2ML IJ SOLN
INTRAMUSCULAR | Status: AC
  Filled 2016-06-22: qty 2

## 2016-06-22 MED ORDER — SODIUM CHLORIDE 0.9 % IV SOLN
INTRAVENOUS | Status: DC | PRN
  Administered 2016-06-22 (×2): via INTRAVENOUS

## 2016-06-22 MED ORDER — FENTANYL CITRATE (PF) 100 MCG/2ML IJ SOLN
INTRAMUSCULAR | Status: AC
  Filled 2016-06-22: qty 2

## 2016-06-22 MED ORDER — LACTATED RINGERS IV SOLN
INTRAVENOUS | Status: DC | PRN
Start: 2016-06-22 — End: 2016-06-23
  Administered 2016-06-22: 22:00:00 via INTRAVENOUS

## 2016-06-22 MED ORDER — LACTATED RINGERS IV SOLN
INTRAVENOUS | Status: DC | PRN
  Administered 2016-06-22: 23:00:00 via INTRAVENOUS

## 2016-06-22 MED ORDER — FENTANYL CITRATE (PF) 100 MCG/2ML IJ SOLN
INTRAMUSCULAR | Status: DC | PRN
  Administered 2016-06-22: 100 ug via INTRAVENOUS
  Administered 2016-06-22 (×2): 50 ug via INTRAVENOUS
  Administered 2016-06-22 – 2016-06-23 (×2): 100 ug via INTRAVENOUS

## 2016-06-22 MED ORDER — SUCCINYLCHOLINE CHLORIDE 20 MG/ML IJ SOLN
INTRAMUSCULAR | Status: DC | PRN
  Administered 2016-06-22: 120 mg via INTRAVENOUS

## 2016-06-22 MED ORDER — 0.9 % SODIUM CHLORIDE (POUR BTL) OPTIME
TOPICAL | Status: DC | PRN
  Administered 2016-06-22: 3000 mL

## 2016-06-22 MED ORDER — SODIUM CHLORIDE 0.9 % IV SOLN
INTRAVENOUS | Status: AC | PRN
  Administered 2016-06-22: 1000 mL via INTRAVENOUS

## 2016-06-22 MED ORDER — DEXTROSE 5 % IV SOLN
2.0000 g | INTRAVENOUS | Status: AC
  Administered 2016-06-22: 2 g via INTRAVENOUS
  Filled 2016-06-22: qty 2

## 2016-06-22 SURGICAL SUPPLY — 50 items
BLADE SURG ROTATE 9660 (MISCELLANEOUS) IMPLANT
BNDG GAUZE ELAST 4 BULKY (GAUZE/BANDAGES/DRESSINGS) ×2 IMPLANT
CANISTER SUCTION 2500CC (MISCELLANEOUS) ×3 IMPLANT
CHLORAPREP W/TINT 26ML (MISCELLANEOUS) ×3 IMPLANT
COVER SURGICAL LIGHT HANDLE (MISCELLANEOUS) ×3 IMPLANT
DRAPE LAPAROSCOPIC ABDOMINAL (DRAPES) ×3 IMPLANT
DRAPE WARM FLUID 44X44 (DRAPE) ×3 IMPLANT
DRSG OPSITE POSTOP 4X10 (GAUZE/BANDAGES/DRESSINGS) IMPLANT
DRSG OPSITE POSTOP 4X8 (GAUZE/BANDAGES/DRESSINGS) IMPLANT
ELECT BLADE 6.5 EXT (BLADE) IMPLANT
ELECT CAUTERY BLADE 6.4 (BLADE) ×3 IMPLANT
ELECT REM PT RETURN 9FT ADLT (ELECTROSURGICAL) ×3
ELECTRODE REM PT RTRN 9FT ADLT (ELECTROSURGICAL) ×1 IMPLANT
GLOVE BIO SURGEON STRL SZ8 (GLOVE) ×3 IMPLANT
GLOVE BIOGEL PI IND STRL 8 (GLOVE) ×1 IMPLANT
GLOVE BIOGEL PI INDICATOR 8 (GLOVE) ×2
GOWN STRL REUS W/ TWL LRG LVL3 (GOWN DISPOSABLE) ×1 IMPLANT
GOWN STRL REUS W/ TWL XL LVL3 (GOWN DISPOSABLE) ×1 IMPLANT
GOWN STRL REUS W/TWL LRG LVL3 (GOWN DISPOSABLE) ×3
GOWN STRL REUS W/TWL XL LVL3 (GOWN DISPOSABLE) ×3
HEMOSTAT SNOW SURGICEL 2X4 (HEMOSTASIS) ×2 IMPLANT
KIT BASIN OR (CUSTOM PROCEDURE TRAY) ×3 IMPLANT
KIT OSTOMY DRAINABLE 2.75 STR (WOUND CARE) ×2 IMPLANT
KIT ROOM TURNOVER OR (KITS) ×3 IMPLANT
LIGASURE IMPACT 36 18CM CVD LR (INSTRUMENTS) ×2 IMPLANT
NS IRRIG 1000ML POUR BTL (IV SOLUTION) ×6 IMPLANT
PACK GENERAL/GYN (CUSTOM PROCEDURE TRAY) ×3 IMPLANT
PAD ARMBOARD 7.5X6 YLW CONV (MISCELLANEOUS) ×3 IMPLANT
RELOAD PROXIMATE 75MM BLUE (ENDOMECHANICALS) ×6 IMPLANT
RELOAD PROXIMATE TA60MM BLUE (ENDOMECHANICALS) ×6 IMPLANT
RELOAD STAPLE 60 BLU REG PROX (ENDOMECHANICALS) IMPLANT
RELOAD STAPLE 75 3.8 BLU REG (ENDOMECHANICALS) IMPLANT
SEALANT PATCH FIBRIN 2X4IN (MISCELLANEOUS) ×2 IMPLANT
SPECIMEN JAR LARGE (MISCELLANEOUS) IMPLANT
SPONGE LAP 18X18 X RAY DECT (DISPOSABLE) IMPLANT
STAPLER CUT CVD 40MM BLUE (STAPLE) ×2 IMPLANT
STAPLER GUN LINEAR PROX 60 (STAPLE) ×2 IMPLANT
STAPLER PROXIMATE 75MM BLUE (STAPLE) ×2 IMPLANT
STAPLER VISISTAT 35W (STAPLE) ×3 IMPLANT
SUCTION POOLE TIP (SUCTIONS) ×3 IMPLANT
SUT PDS AB 1 TP1 96 (SUTURE) ×14 IMPLANT
SUT SILK 2 0 SH CR/8 (SUTURE) ×9 IMPLANT
SUT SILK 2 0 TIES 10X30 (SUTURE) ×3 IMPLANT
SUT SILK 3 0 SH CR/8 (SUTURE) ×3 IMPLANT
SUT SILK 3 0 TIES 10X30 (SUTURE) ×3 IMPLANT
SUT VIC AB 3-0 SH 8-18 (SUTURE) ×4 IMPLANT
TAPE CLOTH SURG 6X10 WHT LF (GAUZE/BANDAGES/DRESSINGS) ×2 IMPLANT
TOWEL OR 17X26 10 PK STRL BLUE (TOWEL DISPOSABLE) ×3 IMPLANT
TRAY FOLEY CATH 16FRSI W/METER (SET/KITS/TRAYS/PACK) IMPLANT
YANKAUER SUCT BULB TIP NO VENT (SUCTIONS) IMPLANT

## 2016-06-22 NOTE — Progress Notes (Signed)
   06/22/16 2200  Clinical Encounter Type  Visited With Patient  Visit Type ED  Spiritual Encounters  Spiritual Needs Emotional  Stress Factors  Patient Stress Factors Major life changes  Introduction to Pt. Pt wanted father called. Left voice message of Pt at Ed. Pt went to OR.
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
## 2016-06-22 NOTE — ED Notes (Signed)
Dr Stark Jock given a copy of lactic acid 2.66

## 2016-06-22 NOTE — Anesthesia Procedure Notes (Signed)
Procedure Name: Intubation Date/Time: 06/22/2016 10:34 PM Performed by: Maude Leriche D Pre-anesthesia Checklist: Patient identified, Emergency Drugs available, Suction available, Patient being monitored and Timeout performed Patient Re-evaluated:Patient Re-evaluated prior to inductionOxygen Delivery Method: Circle system utilized Preoxygenation: Pre-oxygenation with 100% oxygen Intubation Type: IV induction, Rapid sequence and Cricoid Pressure applied Laryngoscope Size: Miller and 2 Grade View: Grade I Tube type: Subglottic suction tube Tube size: 8.0 mm Number of attempts: 1 Airway Equipment and Method: Stylet Placement Confirmation: ETT inserted through vocal cords under direct vision,  positive ETCO2 and breath sounds checked- equal and bilateral Secured at: 23 cm Tube secured with: Tape Dental Injury: Teeth and Oropharynx as per pre-operative assessment
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
## 2016-06-22 NOTE — Progress Notes (Signed)
RT NOTE:  RT responded for Level 1. Pt alert, protecting airway. No respiratory assistance needed at this time.
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
## 2016-06-22 NOTE — Anesthesia Preprocedure Evaluation (Addendum)
Anesthesia Evaluation  Patient identified by MRN, date of birth, ID band Patient awake    Reviewed: Allergy & Precautions, NPO status , Patient's Chart, lab work & pertinent test results, Unable to perform ROS - Chart review onlyPreop documentation limited or incomplete due to emergent nature of procedure.  Airway Mallampati: I  TM Distance: >3 FB Neck ROM: Full    Dental  (+) Teeth Intact   Pulmonary    breath sounds clear to auscultation       Cardiovascular  Rhythm:Regular Rate:Normal     Neuro/Psych    GI/Hepatic   Endo/Other    Renal/GU      Musculoskeletal   Abdominal   Peds  Hematology   Anesthesia Other Findings   Reproductive/Obstetrics                            Anesthesia Physical Anesthesia Plan  ASA: IV and emergent  Anesthesia Plan: General   Post-op Pain Management:    Induction: Intravenous, Rapid sequence and Cricoid pressure planned  Airway Management Planned: Oral ETT  Additional Equipment: Arterial line  Intra-op Plan:   Post-operative Plan: Post-operative intubation/ventilation  Informed Consent: I have reviewed the patients History and Physical, chart, labs and discussed the procedure including the risks, benefits and alternatives for the proposed anesthesia with the patient or authorized representative who has indicated his/her understanding and acceptance.   Only emergency history available and History available from chart only  Plan Discussed with: CRNA, Anesthesiologist and Surgeon  Anesthesia Plan Comments:        Anesthesia Quick Evaluation

## 2016-06-23 ENCOUNTER — Inpatient Hospital Stay (HOSPITAL_COMMUNITY): Payer: BLUE CROSS/BLUE SHIELD

## 2016-06-23 ENCOUNTER — Encounter (HOSPITAL_COMMUNITY): Payer: Self-pay | Admitting: Emergency Medicine

## 2016-06-23 LAB — BASIC METABOLIC PANEL
ANION GAP: 8 (ref 5–15)
BUN: 9 mg/dL (ref 6–20)
CALCIUM: 8.2 mg/dL — AB (ref 8.9–10.3)
CO2: 21 mmol/L — ABNORMAL LOW (ref 22–32)
Chloride: 108 mmol/L (ref 101–111)
Creatinine, Ser: 0.94 mg/dL (ref 0.61–1.24)
GLUCOSE: 159 mg/dL — AB (ref 65–99)
Potassium: 4 mmol/L (ref 3.5–5.1)
Sodium: 137 mmol/L (ref 135–145)

## 2016-06-23 LAB — PREPARE FRESH FROZEN PLASMA
BLOOD PRODUCT EXPIRATION DATE: 201803102359
BLOOD PRODUCT EXPIRATION DATE: 201803122359
Blood Product Expiration Date: 201803082359
Blood Product Expiration Date: 201803102359
ISSUE DATE / TIME: 201802180508
ISSUE DATE / TIME: 201802172203
ISSUE DATE / TIME: 201802172203
ISSUE DATE / TIME: 201802180508
UNIT TYPE AND RH: 6200
Unit Type and Rh: 6200
Unit Type and Rh: 6200
Unit Type and Rh: 6200

## 2016-06-23 LAB — BLOOD GAS, ARTERIAL
ACID-BASE DEFICIT: 2.6 mmol/L — AB (ref 0.0–2.0)
ACID-BASE DEFICIT: 2.8 mmol/L — AB (ref 0.0–2.0)
BICARBONATE: 22.2 mmol/L (ref 20.0–28.0)
Bicarbonate: 21.3 mmol/L (ref 20.0–28.0)
DRAWN BY: 44135
Drawn by: 44135
FIO2: 40
FIO2: 30
LHR: 18 {breaths}/min
MECHVT: 620 mL
O2 SAT: 98.9 %
O2 Saturation: 99 %
PATIENT TEMPERATURE: 98.6
PEEP/CPAP: 5 cmH2O
PEEP/CPAP: 5 cmH2O
PH ART: 7.346 — AB (ref 7.350–7.450)
Patient temperature: 98.6
RATE: 18 resp/min
VT: 620 mL
pCO2 arterial: 41.7 mmHg (ref 32.0–48.0)
pCO2 arterial: 35.7 mmHg (ref 32.0–48.0)
pH, Arterial: 7.393 (ref 7.350–7.450)
pO2, Arterial: 208 mmHg — ABNORMAL HIGH (ref 83.0–108.0)
pO2, Arterial: 159 mmHg — ABNORMAL HIGH (ref 83.0–108.0)

## 2016-06-23 LAB — CBC
HCT: 42.2 % (ref 39.0–52.0)
HCT: 37.1 % — ABNORMAL LOW (ref 39.0–52.0)
HEMATOCRIT: 42.2 % (ref 39.0–52.0)
HEMATOCRIT: 41.5 % (ref 39.0–52.0)
HEMOGLOBIN: 14.5 g/dL (ref 13.0–17.0)
HEMOGLOBIN: 12.5 g/dL — AB (ref 13.0–17.0)
Hemoglobin: 14.4 g/dL (ref 13.0–17.0)
Hemoglobin: 14.2 g/dL (ref 13.0–17.0)
MCH: 29.3 pg (ref 26.0–34.0)
MCH: 29.4 pg (ref 26.0–34.0)
MCH: 29.2 pg (ref 26.0–34.0)
MCH: 28.9 pg (ref 26.0–34.0)
MCHC: 34.1 g/dL (ref 30.0–36.0)
MCHC: 34.4 g/dL (ref 30.0–36.0)
MCHC: 34.2 g/dL (ref 30.0–36.0)
MCHC: 33.7 g/dL (ref 30.0–36.0)
MCV: 85.8 fL (ref 78.0–100.0)
MCV: 85.6 fL (ref 78.0–100.0)
MCV: 85.4 fL (ref 78.0–100.0)
MCV: 85.7 fL (ref 78.0–100.0)
PLATELETS: 196 10*3/uL (ref 150–400)
PLATELETS: 178 10*3/uL (ref 150–400)
Platelets: 189 10*3/uL (ref 150–400)
Platelets: 170 10*3/uL (ref 150–400)
RBC: 4.92 MIL/uL (ref 4.22–5.81)
RBC: 4.93 MIL/uL (ref 4.22–5.81)
RBC: 4.86 MIL/uL (ref 4.22–5.81)
RBC: 4.33 MIL/uL (ref 4.22–5.81)
RDW: 13.8 % (ref 11.5–15.5)
RDW: 13.9 % (ref 11.5–15.5)
RDW: 13.9 % (ref 11.5–15.5)
RDW: 13.8 % (ref 11.5–15.5)
WBC: 9.4 10*3/uL (ref 4.0–10.5)
WBC: 7.4 10*3/uL (ref 4.0–10.5)
WBC: 16.1 10*3/uL — AB (ref 4.0–10.5)
WBC: 19.5 10*3/uL — AB (ref 4.0–10.5)

## 2016-06-23 LAB — URINALYSIS, ROUTINE W REFLEX MICROSCOPIC
Glucose, UA: 250 mg/dL — AB
KETONES UR: 15 mg/dL — AB
NITRITE: POSITIVE — AB
Protein, ur: >300 mg/dL — AB
Specific Gravity, Urine: 1.025 (ref 1.005–1.030)
pH: 6.5 (ref 5.0–8.0)

## 2016-06-23 LAB — MRSA PCR SCREENING: MRSA by PCR: NEGATIVE

## 2016-06-23 LAB — POCT I-STAT 7, (LYTES, BLD GAS, ICA,H+H)
Acid-Base Excess: 1 mmol/L (ref 0.0–2.0)
BICARBONATE: 26.8 mmol/L (ref 20.0–28.0)
CALCIUM ION: 1.12 mmol/L — AB (ref 1.15–1.40)
HEMATOCRIT: 36 % — AB (ref 39.0–52.0)
Hemoglobin: 12.2 g/dL — ABNORMAL LOW (ref 13.0–17.0)
O2 SAT: 100 %
PCO2 ART: 42.8 mmHg (ref 32.0–48.0)
POTASSIUM: 4.3 mmol/L (ref 3.5–5.1)
Patient temperature: 34.6
SODIUM: 138 mmol/L (ref 135–145)
TCO2: 28 mmol/L (ref 0–100)
pH, Arterial: 7.393 (ref 7.350–7.450)
pO2, Arterial: 650 mmHg — ABNORMAL HIGH (ref 83.0–108.0)

## 2016-06-23 LAB — URINALYSIS, MICROSCOPIC (REFLEX): WBC UA: NONE SEEN WBC/hpf (ref 0–5)

## 2016-06-23 LAB — PREPARE PLATELET PHERESIS: Unit division: 0

## 2016-06-23 LAB — DIC (DISSEMINATED INTRAVASCULAR COAGULATION) PANEL
D DIMER QUANT: 10.21 ug{FEU}/mL — AB (ref 0.00–0.50)
PLATELETS: 215 10*3/uL (ref 150–400)
SMEAR REVIEW: NONE SEEN

## 2016-06-23 LAB — PROTIME-INR
INR: 1.23
PROTHROMBIN TIME: 15.5 s — AB (ref 11.4–15.2)

## 2016-06-23 LAB — DIC (DISSEMINATED INTRAVASCULAR COAGULATION)PANEL
Fibrinogen: 131 mg/dL — ABNORMAL LOW (ref 210–475)
INR: 1.29
Prothrombin Time: 16.1 seconds — ABNORMAL HIGH (ref 11.4–15.2)
aPTT: 28 seconds (ref 24–36)

## 2016-06-23 LAB — TRIGLYCERIDES: Triglycerides: 48 mg/dL (ref <150)

## 2016-06-23 LAB — HIV ANTIBODY (ROUTINE TESTING W REFLEX): HIV SCREEN 4TH GENERATION: NONREACTIVE

## 2016-06-23 LAB — ABO/RH: ABO/RH(D): O POS

## 2016-06-23 MED ORDER — ONDANSETRON HCL 4 MG/2ML IJ SOLN: 4.0000 mg | Freq: Four times a day (QID) | INTRAMUSCULAR | Status: DC | PRN

## 2016-06-23 MED ORDER — ORAL CARE MOUTH RINSE
15.0000 mL | Freq: Four times a day (QID) | OROMUCOSAL | Status: DC
  Administered 2016-06-23 – 2016-06-30 (×14): 15 mL via OROMUCOSAL

## 2016-06-23 MED ORDER — CHLORHEXIDINE GLUCONATE 0.12% ORAL RINSE (MEDLINE KIT)
15.0000 mL | Freq: Two times a day (BID) | OROMUCOSAL | Status: DC
  Administered 2016-06-23 – 2016-06-29 (×9): 15 mL via OROMUCOSAL

## 2016-06-23 MED ORDER — ONDANSETRON HCL 4 MG/2ML IJ SOLN
4.0000 mg | Freq: Four times a day (QID) | INTRAMUSCULAR | Status: DC | PRN
  Administered 2016-06-23: 4 mg via INTRAVENOUS
  Filled 2016-06-23: qty 2

## 2016-06-23 MED ORDER — FENTANYL CITRATE (PF) 100 MCG/2ML IJ SOLN: 50.0000 ug | Freq: Once | INTRAMUSCULAR | Status: DC

## 2016-06-23 MED ORDER — KCL IN DEXTROSE-NACL 20-5-0.45 MEQ/L-%-% IV SOLN
INTRAVENOUS | Status: DC
  Administered 2016-06-23 – 2016-06-24 (×3): via INTRAVENOUS
  Filled 2016-06-23 (×4): qty 1000

## 2016-06-23 MED ORDER — IOPAMIDOL (ISOVUE-300) INJECTION 61%
INTRAVENOUS | Status: AC
  Administered 2016-06-23: 100 mL
  Filled 2016-06-23: qty 100

## 2016-06-23 MED ORDER — DIPHENHYDRAMINE HCL 50 MG/ML IJ SOLN
12.5000 mg | Freq: Four times a day (QID) | INTRAMUSCULAR | Status: DC | PRN
  Administered 2016-06-23 – 2016-06-27 (×2): 12.5 mg via INTRAVENOUS
  Filled 2016-06-23 (×2): qty 1

## 2016-06-23 MED ORDER — SODIUM CHLORIDE 0.9% FLUSH: 9.0000 mL | INTRAVENOUS | Status: DC | PRN

## 2016-06-23 MED ORDER — FENTANYL BOLUS VIA INFUSION
50.0000 ug | INTRAVENOUS | Status: DC | PRN
  Filled 2016-06-23: qty 50

## 2016-06-23 MED ORDER — DIPHENHYDRAMINE HCL 12.5 MG/5ML PO ELIX: 12.5000 mg | ORAL_SOLUTION | Freq: Four times a day (QID) | ORAL | Status: DC | PRN

## 2016-06-23 MED ORDER — PANTOPRAZOLE SODIUM 40 MG PO TBEC
40.0000 mg | DELAYED_RELEASE_TABLET | Freq: Every day | ORAL | Status: DC
  Administered 2016-06-30 – 2016-07-01 (×2): 40 mg via ORAL
  Filled 2016-06-23 (×2): qty 1

## 2016-06-23 MED ORDER — ONDANSETRON HCL 4 MG PO TABS
4.0000 mg | ORAL_TABLET | Freq: Four times a day (QID) | ORAL | Status: DC | PRN
Start: 2016-06-23 — End: 2016-07-01

## 2016-06-23 MED ORDER — DEXMEDETOMIDINE HCL IN NACL 200 MCG/50ML IV SOLN
INTRAVENOUS | Status: DC | PRN
  Administered 2016-06-23: .7 ug/kg/h via INTRAVENOUS

## 2016-06-23 MED ORDER — SODIUM CHLORIDE 0.9 % IV SOLN
25.0000 ug/h | INTRAVENOUS | Status: DC
  Administered 2016-06-23: 50 ug/h via INTRAVENOUS
  Filled 2016-06-23: qty 50

## 2016-06-23 MED ORDER — MIDAZOLAM HCL 2 MG/2ML IJ SOLN
INTRAMUSCULAR | Status: AC
  Filled 2016-06-23: qty 2

## 2016-06-23 MED ORDER — NALOXONE HCL 0.4 MG/ML IJ SOLN: 0.4000 mg | INTRAMUSCULAR | Status: DC | PRN

## 2016-06-23 MED ORDER — FENTANYL CITRATE (PF) 100 MCG/2ML IJ SOLN
INTRAMUSCULAR | Status: AC
  Filled 2016-06-23: qty 2

## 2016-06-23 MED ORDER — PROPOFOL 1000 MG/100ML IV EMUL
0.0000 ug/kg/min | INTRAVENOUS | Status: DC
  Administered 2016-06-23 (×2): 20 ug/kg/min via INTRAVENOUS
  Filled 2016-06-23: qty 100

## 2016-06-23 MED ORDER — MORPHINE SULFATE 2 MG/ML IV SOLN
INTRAVENOUS | Status: DC
  Administered 2016-06-23 (×2): via INTRAVENOUS
  Administered 2016-06-23: 22.5 mg via INTRAVENOUS
  Administered 2016-06-24: 13.5 mg via INTRAVENOUS
  Administered 2016-06-24: 11:00:00 via INTRAVENOUS
  Administered 2016-06-24: 10 mg via INTRAVENOUS
  Administered 2016-06-24: 15 mg via INTRAVENOUS
  Administered 2016-06-24 (×2): 24 mg via INTRAVENOUS
  Administered 2016-06-25: 5 mg via INTRAVENOUS
  Administered 2016-06-25: 11 mg via INTRAVENOUS
  Administered 2016-06-25: 16 mg via INTRAVENOUS
  Administered 2016-06-25: 15 mg via INTRAVENOUS
  Administered 2016-06-25: 12 mg via INTRAVENOUS
  Administered 2016-06-26: 19.5 mg via INTRAVENOUS
  Administered 2016-06-26: 02:00:00 via INTRAVENOUS
  Administered 2016-06-26: 25.39 mg via INTRAVENOUS
  Administered 2016-06-26: 10.5 mg via INTRAVENOUS
  Administered 2016-06-26: 6 mg via INTRAVENOUS
  Administered 2016-06-26: 19.5 mg via INTRAVENOUS
  Administered 2016-06-26: 6 mg via INTRAVENOUS
  Administered 2016-06-27: 33 mg via INTRAVENOUS
  Administered 2016-06-27: 17:00:00 via INTRAVENOUS
  Administered 2016-06-27: 25.5 mg via INTRAVENOUS
  Administered 2016-06-27: 18 mg via INTRAVENOUS
  Administered 2016-06-27: 9 mg via INTRAVENOUS
  Administered 2016-06-27: 06:00:00 via INTRAVENOUS
  Administered 2016-06-28: 25.5 mg via INTRAVENOUS
  Administered 2016-06-28: 1.5 mg via INTRAVENOUS
  Administered 2016-06-28: 18 mg via INTRAVENOUS
  Administered 2016-06-28: 10:00:00 via INTRAVENOUS
  Administered 2016-06-28: 6 mg via INTRAVENOUS
  Filled 2016-06-23 (×10): qty 30

## 2016-06-23 MED ORDER — PANTOPRAZOLE SODIUM 40 MG IV SOLR
40.0000 mg | Freq: Every day | INTRAVENOUS | Status: DC
  Administered 2016-06-23 – 2016-06-29 (×7): 40 mg via INTRAVENOUS
  Filled 2016-06-23 (×6): qty 40

## 2016-06-23 NOTE — Progress Notes (Signed)
65ml of fentanyl 12mcg/ml wasted in the sink with Arlean Hopping.

## 2016-06-23 NOTE — Procedures (Signed)
Extubation Procedure Note  Patient Details:   Name: Nathan Vincent DOB: Sep 05, 1995 MRN: [id number]   Airway Documentation:     Evaluation  O2 sats: stable throughout Complications: No apparent complications Patient did tolerate procedure well. Bilateral Breath Sounds: Clear, Diminished   Yes  2l/min [place] Incentive instructed 252ml  Revonda Standard 06/23/2016, 12:28 PM

## 2016-06-23 NOTE — Anesthesia Postprocedure Evaluation (Signed)
Anesthesia Post Note  Patient: Nathan Vincent  Procedure(s) Performed: Procedure(s) (LRB): EXPLORATORY LAPAROTOMY (N/A)  Patient location during evaluation: SICU Anesthesia Type: General Level of consciousness: sedated Pain management: pain level controlled Vital Signs Assessment: post-procedure vital signs reviewed and stable Respiratory status: patient remains intubated per anesthesia plan Cardiovascular status: stable Anesthetic complications: no       Last Vitals:  Vitals:   06/23/16 0105 06/23/16 0133  BP:    Pulse: (!) 102   Resp: 18   Temp:  36.4 C    Last Pain:  Vitals:   06/23/16 0133  TempSrc: Axillary  PainSc:                  Tiajuana Amass
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
## 2016-06-23 NOTE — Transfer of Care (Signed)
Immediate Anesthesia Transfer of Care Note  Patient: Nathan Vincent  Procedure(s) Performed: Procedure(s): EXPLORATORY LAPAROTOMY (N/A)  Patient Location: ICU  Anesthesia Type:General  Level of Consciousness: sedated and Patient remains intubated per anesthesia plan  Airway & Oxygen Therapy: Patient remains intubated per anesthesia plan and Patient placed on Ventilator (see vital sign flow sheet for setting)  Post-op Assessment: Report given to RN and Post -op Vital signs reviewed and stable  Post vital signs: Reviewed and stable  Last Vitals:  Vitals:   06/22/16 2210 06/22/16 2212  BP:  121/86  Pulse: 66 72  Resp: 13 17  Temp:      Last Pain:  Vitals:   06/22/16 2216  TempSrc:   PainSc: 8          Complications: No apparent anesthesia complications

## 2016-06-24 ENCOUNTER — Encounter (HOSPITAL_COMMUNITY): Payer: Self-pay | Admitting: General Surgery

## 2016-06-24 LAB — POCT I-STAT 7, (LYTES, BLD GAS, ICA,H+H)
ACID-BASE DEFICIT: 3 mmol/L — AB (ref 0.0–2.0)
Acid-base deficit: 2 mmol/L (ref 0.0–2.0)
BICARBONATE: 23.3 mmol/L (ref 20.0–28.0)
Bicarbonate: 24.1 mmol/L (ref 20.0–28.0)
CALCIUM ION: 1.16 mmol/L (ref 1.15–1.40)
Calcium, Ion: 1.05 mmol/L — ABNORMAL LOW (ref 1.15–1.40)
HCT: 34 % — ABNORMAL LOW (ref 39.0–52.0)
HCT: 37 % — ABNORMAL LOW (ref 39.0–52.0)
HEMOGLOBIN: 11.6 g/dL — AB (ref 13.0–17.0)
HEMOGLOBIN: 12.6 g/dL — AB (ref 13.0–17.0)
O2 Saturation: 100 %
O2 Saturation: 100 %
PCO2 ART: 43.1 mmHg (ref 32.0–48.0)
PH ART: 7.348 — AB (ref 7.350–7.450)
PO2 ART: 635 mmHg — AB (ref 83.0–108.0)
Potassium: 3.1 mmol/L — ABNORMAL LOW (ref 3.5–5.1)
Potassium: 3.4 mmol/L — ABNORMAL LOW (ref 3.5–5.1)
SODIUM: 141 mmol/L (ref 135–145)
Sodium: 140 mmol/L (ref 135–145)
TCO2: 25 mmol/L (ref 0–100)
TCO2: 26 mmol/L (ref 0–100)
pCO2 arterial: 41.5 mmHg (ref 32.0–48.0)
pH, Arterial: 7.344 — ABNORMAL LOW (ref 7.350–7.450)
pO2, Arterial: 615 mmHg — ABNORMAL HIGH (ref 83.0–108.0)

## 2016-06-24 LAB — CBC
HCT: 33.5 % — ABNORMAL LOW (ref 39.0–52.0)
HEMOGLOBIN: 11.3 g/dL — AB (ref 13.0–17.0)
MCH: 29 pg (ref 26.0–34.0)
MCHC: 33.7 g/dL (ref 30.0–36.0)
MCV: 85.9 fL (ref 78.0–100.0)
Platelets: 161 10*3/uL (ref 150–400)
RBC: 3.9 MIL/uL — AB (ref 4.22–5.81)
RDW: 13.8 % (ref 11.5–15.5)
WBC: 19.2 10*3/uL — ABNORMAL HIGH (ref 4.0–10.5)

## 2016-06-24 LAB — BASIC METABOLIC PANEL
Anion gap: 4 — ABNORMAL LOW (ref 5–15)
BUN: 8 mg/dL (ref 6–20)
CHLORIDE: 101 mmol/L (ref 101–111)
CO2: 30 mmol/L (ref 22–32)
Calcium: 8.7 mg/dL — ABNORMAL LOW (ref 8.9–10.3)
Creatinine, Ser: 0.78 mg/dL (ref 0.61–1.24)
GFR calc Af Amer: >60 mL/min (ref >60)
GFR calc non Af Amer: >60 mL/min (ref >60)
GLUCOSE: 119 mg/dL — AB (ref 65–99)
POTASSIUM: 4.9 mmol/L (ref 3.5–5.1)
Sodium: 135 mmol/L (ref 135–145)

## 2016-06-24 MED ORDER — DEXTROSE-NACL 5-0.45 % IV SOLN
INTRAVENOUS | Status: DC
  Administered 2016-06-24 – 2016-06-30 (×10): via INTRAVENOUS

## 2016-06-24 MED ORDER — DEXTROSE-NACL 5-0.45 % IV SOLN: INTRAVENOUS | Status: DC

## 2016-06-24 MED ORDER — METHOCARBAMOL 1000 MG/10ML IJ SOLN
750.0000 mg | Freq: Three times a day (TID) | INTRAVENOUS | Status: DC | PRN
  Administered 2016-06-24 – 2016-06-30 (×12): 750 mg via INTRAVENOUS
  Filled 2016-06-24 (×20): qty 7.5

## 2016-06-24 MED ORDER — PHENOL 1.4 % MT LIQD
1.0000 | OROMUCOSAL | Status: DC | PRN
  Administered 2016-06-25: 1 via OROMUCOSAL
  Filled 2016-06-24 (×2): qty 177

## 2016-06-24 MED ORDER — MENTHOL 3 MG MT LOZG
1.0000 | LOZENGE | OROMUCOSAL | Status: DC | PRN
  Administered 2016-06-24 – 2016-06-26 (×2): 3 mg via ORAL
  Filled 2016-06-24 (×3): qty 9
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
## 2016-06-24 NOTE — Progress Notes (Signed)
Rehab Admissions Coordinator Note:  Patient was screened by Cleatrice Burke for appropriateness for an Inpatient Acute Rehab Consult per OT recommendation.  At this time, we are recommending Inpatient Rehab consult. Please place order for consult if pt would like to be assessed for admission.   Cleatrice Burke 06/24/2016, 7:40 PM  I can be reached at [phone].

## 2016-06-24 NOTE — Clinical Social Work Note (Signed)
Clinical Social Worker attempted to visit with patient twice, however patient was being transferred to the floor and now working with Occupational Therapy.  CSW to follow up with patient to complete full assessment.  [name], Nathan Vincent

## 2016-06-24 NOTE — Consult Note (Addendum)
[place] Nurse wound consult note Trauma service following for assessment and plan of care.  Requested to apply Vac to abd wound Wound type: Full thickness post-op wound to midline abd Measurement: 23X4X.3cm Wound bed: beefy red Drainage (amount, consistency, odor) scant amt pink drainage, no odor Periwound: Intact skin surrounding Dressing procedure/placement/frequency: Applied one piece black foam to 173mm cont suction.  Pt tolerated with min amt discomfort after using PCA for pain prior to procedure.  Plan to change dressing on Wed.  [place] Nurse ostomy consult note Colostomy stoma is located close to abd wound and pouch change will need to be performed with each Vac dressing change. Stoma type/location:  Stoma is 1 3/4 inches, red and viable, slightly above skin level Peristomal assessment:  Intact skin surrounding Output: Small amt bloody drainage, no stool or flatus  Ostomy pouching: 2pc. with barrier ring to maintain seal Education provided:  Demonstrated pouch change process and pt asked appropriate questions.  Supplies ordered to the bedside for staff nurse use.  Will continue ostomy teaching sessions when stable and out of ICU. Enrolled patient in [facility] program: No Julien Girt MSN, [place], [place], [place], [place]
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
## 2016-06-25 LAB — BASIC METABOLIC PANEL
ANION GAP: 10 (ref 5–15)
BUN: 6 mg/dL (ref 6–20)
CHLORIDE: 96 mmol/L — AB (ref 101–111)
CO2: 27 mmol/L (ref 22–32)
CREATININE: 0.71 mg/dL (ref 0.61–1.24)
Calcium: 8.8 mg/dL — ABNORMAL LOW (ref 8.9–10.3)
GFR calc non Af Amer: >60 mL/min (ref >60)
Glucose, Bld: 110 mg/dL — ABNORMAL HIGH (ref 65–99)
POTASSIUM: 4 mmol/L (ref 3.5–5.1)
SODIUM: 133 mmol/L — AB (ref 135–145)

## 2016-06-25 LAB — CBC
HEMATOCRIT: 30.5 % — AB (ref 39.0–52.0)
HEMOGLOBIN: 10.2 g/dL — AB (ref 13.0–17.0)
MCH: 28.4 pg (ref 26.0–34.0)
MCHC: 33.4 g/dL (ref 30.0–36.0)
MCV: 85 fL (ref 78.0–100.0)
Platelets: 143 10*3/uL — ABNORMAL LOW (ref 150–400)
RBC: 3.59 MIL/uL — AB (ref 4.22–5.81)
RDW: 13.3 % (ref 11.5–15.5)
WBC: 16.8 10*3/uL — AB (ref 4.0–10.5)
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
## 2016-06-26 LAB — TYPE AND SCREEN
BLOOD PRODUCT EXPIRATION DATE: 201803132359
BLOOD PRODUCT EXPIRATION DATE: 201803172359
Blood Product Expiration Date: 201802222359
Blood Product Expiration Date: 201803152359
Blood Product Expiration Date: 201803172359
Blood Product Expiration Date: 201803172359
Blood Product Expiration Date: 201803172359
Blood Product Expiration Date: 201803182359
ISSUE DATE / TIME: 201802172202
ISSUE DATE / TIME: 201802172202
ISSUE DATE / TIME: 201802191230
ISSUE DATE / TIME: 201802191230
ISSUE DATE / TIME: 201802200810
ISSUE DATE / TIME: 201802201154
UNIT TYPE AND RH: 5100
UNIT TYPE AND RH: 5100
UNIT TYPE AND RH: 5100
UNIT TYPE AND RH: 9500
Unit Type and Rh: 5100
Unit Type and Rh: 9500
Unit Type and Rh: 9500
Unit Type and Rh: 9500

## 2016-06-26 LAB — CBC
HEMATOCRIT: 26.7 % — AB (ref 39.0–52.0)
HEMOGLOBIN: 9.3 g/dL — AB (ref 13.0–17.0)
MCH: 29.2 pg (ref 26.0–34.0)
MCHC: 34.8 g/dL (ref 30.0–36.0)
MCV: 83.7 fL (ref 78.0–100.0)
Platelets: 140 10*3/uL — ABNORMAL LOW (ref 150–400)
RBC: 3.19 MIL/uL — ABNORMAL LOW (ref 4.22–5.81)
RDW: 12.9 % (ref 11.5–15.5)
WBC: 13.5 10*3/uL — ABNORMAL HIGH (ref 4.0–10.5)

## 2016-06-26 LAB — BASIC METABOLIC PANEL
ANION GAP: 8 (ref 5–15)
BUN: <5 mg/dL — ABNORMAL LOW (ref 6–20)
CO2: 28 mmol/L (ref 22–32)
Calcium: 8.4 mg/dL — ABNORMAL LOW (ref 8.9–10.3)
Chloride: 96 mmol/L — ABNORMAL LOW (ref 101–111)
Creatinine, Ser: 0.6 mg/dL — ABNORMAL LOW (ref 0.61–1.24)
GFR calc Af Amer: >60 mL/min (ref >60)
Glucose, Bld: 108 mg/dL — ABNORMAL HIGH (ref 65–99)
POTASSIUM: 3.2 mmol/L — AB (ref 3.5–5.1)
SODIUM: 132 mmol/L — AB (ref 135–145)

## 2016-06-26 LAB — TRIGLYCERIDES: TRIGLYCERIDES: 75 mg/dL (ref <150)

## 2016-06-26 MED ORDER — POTASSIUM CHLORIDE 2 MEQ/ML IV SOLN
30.0000 meq | Freq: Once | INTRAVENOUS | Status: AC
  Administered 2016-06-26: 30 meq via INTRAVENOUS
  Filled 2016-06-26: qty 15

## 2016-06-26 MED ORDER — MORPHINE SULFATE (PF) 2 MG/ML IV SOLN
2.0000 mg | INTRAVENOUS | Status: DC | PRN
  Administered 2016-06-26: 2 mg via INTRAVENOUS
  Filled 2016-06-26: qty 1
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
## 2016-06-26 NOTE — Progress Notes (Signed)
Patient ID: Nathan Vincent, male   DOB: [birth year], 20 y.o.   MRN: [id number]  Rechecked patient this afternoon. Continues to have good BS, but no air in colostomy bag. Continue NGT to LIWS and NPO except sips/chips.  Hagop Mccollam A MILLER
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
## 2016-06-27 LAB — BASIC METABOLIC PANEL
Anion gap: 10 (ref 5–15)
BUN: 7 mg/dL (ref 6–20)
CHLORIDE: 97 mmol/L — AB (ref 101–111)
CO2: 26 mmol/L (ref 22–32)
CREATININE: 0.62 mg/dL (ref 0.61–1.24)
Calcium: 8.6 mg/dL — ABNORMAL LOW (ref 8.9–10.3)
GFR calc Af Amer: >60 mL/min (ref >60)
GFR calc non Af Amer: >60 mL/min (ref >60)
Glucose, Bld: 90 mg/dL (ref 65–99)
POTASSIUM: 3.2 mmol/L — AB (ref 3.5–5.1)
Sodium: 133 mmol/L — ABNORMAL LOW (ref 135–145)

## 2016-06-27 LAB — CBC
HEMATOCRIT: 27.7 % — AB (ref 39.0–52.0)
HEMOGLOBIN: 9.4 g/dL — AB (ref 13.0–17.0)
MCH: 28.6 pg (ref 26.0–34.0)
MCHC: 33.9 g/dL (ref 30.0–36.0)
MCV: 84.2 fL (ref 78.0–100.0)
Platelets: 220 10*3/uL (ref 150–400)
RBC: 3.29 MIL/uL — AB (ref 4.22–5.81)
RDW: 13.1 % (ref 11.5–15.5)
WBC: 12.4 10*3/uL — ABNORMAL HIGH (ref 4.0–10.5)

## 2016-06-27 MED ORDER — KCL IN DEXTROSE-NACL 40-5-0.45 MEQ/L-%-% IV SOLN
INTRAVENOUS | Status: DC
  Administered 2016-06-27 – 2016-06-28 (×3): via INTRAVENOUS
  Filled 2016-06-27 (×4): qty 1000

## 2016-06-27 MED ORDER — SODIUM CHLORIDE 0.9 % IV SOLN
30.0000 meq | Freq: Once | INTRAVENOUS | Status: AC
  Administered 2016-06-27: 30 meq via INTRAVENOUS
  Filled 2016-06-27: qty 15
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
## 2016-06-27 NOTE — Progress Notes (Signed)
Pt with no bowel function yet; continue NG tube to low intermittent suction.  Pain controlled with full dose PCA Morphine.  PT/OT recommending CIR, though pt likely to progress to home with [facility] or no follow up once NG out and feeling better.  Will follow progress.    Reinaldo Raddle, RN, BSN  Trauma/Neuro ICU Case Manager [phone]
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
## 2016-06-28 ENCOUNTER — Inpatient Hospital Stay (HOSPITAL_COMMUNITY): Payer: BLUE CROSS/BLUE SHIELD

## 2016-06-28 LAB — CBC
HCT: 26.9 % — ABNORMAL LOW (ref 39.0–52.0)
Hemoglobin: 9.4 g/dL — ABNORMAL LOW (ref 13.0–17.0)
MCH: 28.8 pg (ref 26.0–34.0)
MCHC: 34.9 g/dL (ref 30.0–36.0)
MCV: 82.5 fL (ref 78.0–100.0)
PLATELETS: 266 10*3/uL (ref 150–400)
RBC: 3.26 MIL/uL — ABNORMAL LOW (ref 4.22–5.81)
RDW: 12.8 % (ref 11.5–15.5)
WBC: 15.6 10*3/uL — AB (ref 4.0–10.5)

## 2016-06-28 MED ORDER — MORPHINE SULFATE (PF) 2 MG/ML IV SOLN
2.0000 mg | INTRAVENOUS | Status: DC | PRN
  Administered 2016-06-28 – 2016-06-29 (×5): 4 mg via INTRAVENOUS
  Filled 2016-06-28 (×5): qty 2

## 2016-06-28 NOTE — Clinical Social Work Note (Signed)
Clinical Social Work Assessment  Patient Details  Name: Nathan Vincent MRN: [id number] Date of Birth: 1995-06-17  Date of referral:  06/28/16               Reason for consult:  Trauma                Permission sought to share information with:  Family Supports Permission granted to share information::  Yes, Verbal Permission Granted  Name::     Yotam Rhine  Agency::     Relationship::  Father  Contact Information:  [phone]  Housing/Transportation Living arrangements for the past 2 months:  Apartment Source of Information:  Patient Patient Interpreter Needed:  None Criminal Activity/Legal Involvement Pertinent to Current Situation/Hospitalization:  No - Comment as needed Significant Relationships:  Parents Lives with:  Parents (Father) Do you feel safe going back to the place where you live?  Yes Need for family participation in patient care:  Yes (Comment)  Care giving concerns: No caregiving concerns.    Social Worker assessment / plan: CSW addressed trauma consult. CSW met with pt bedside. Pt was watching "the Office" tv show on his laptop. CSW introduced self and explained CSW responsibilities. Pt was pleasant. Pt stated he is sleepy due to interruptions by staff. Pt stated he sleeps well other than interruptions. Pt stated he lives with his father and feels safe returning home. CSW provided and discussed acute response packet with pt. Pt feels he has good family support. Pt informed if he needs to talk or has questions that CSW is available. SBIRT completed. No substance or ETOH disclosed. Unit CSW will continue to follow.   P/T recommendingCSW signing off as no further needs identified.   Employment status:  Unemployed Forensic scientist:  Other (Comment Required) ([employer]) PT Recommendations:  No Follow Up Information / Referral to community resources:  Outpatient Psychiatric Care (Comment Required), SBIRT  Patient/Family's Response to  care:  Pt appreciative of resources and CSW support.   Patient/Family's Understanding of and Emotional Response to Diagnosis, Current Treatment, and Prognosis:  Pt understands the need for assistance with healing and strength building.   Emotional Assessment Appearance:  Appears stated age Attitude/Demeanor/Rapport:   (Appropriate) Affect (typically observed):  Accepting, Pleasant Orientation:  Oriented to Self, Oriented to Place, Oriented to  Time, Oriented to Situation Alcohol / Substance use:  Other Psych involvement (Current and /or in the community):  No (Comment)  Discharge Needs  Concerns to be addressed:  Discharge Planning Concerns Readmission within the last 30 days:  No Current discharge risk:  Physical Impairment Barriers to Discharge:  Continued Medical Work up   Truitt Merle, LCSW 06/28/2016, 5:41 PM

## 2016-06-28 NOTE — Progress Notes (Signed)
Pt called me to the room and complained that his NGT was curled in his throat, causing him breathing difficulty. It did appear to be coiled in his throat, so I removed it per his request. Kathlee Nations, [place] notified. Received order to leave the tube out for now.
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
## 2016-06-28 NOTE — Progress Notes (Signed)
Pt is at supervision level with therapy today. Recommend home with [facility] when medically ready. Has progressed well with therapy since 06/24/16.

## 2016-06-29 LAB — BASIC METABOLIC PANEL
ANION GAP: 10 (ref 5–15)
BUN: 6 mg/dL (ref 6–20)
CO2: 26 mmol/L (ref 22–32)
Calcium: 9.1 mg/dL (ref 8.9–10.3)
Chloride: 98 mmol/L — ABNORMAL LOW (ref 101–111)
Creatinine, Ser: 0.62 mg/dL (ref 0.61–1.24)
Glucose, Bld: 122 mg/dL — ABNORMAL HIGH (ref 65–99)
POTASSIUM: 3.8 mmol/L (ref 3.5–5.1)
SODIUM: 134 mmol/L — AB (ref 135–145)

## 2016-06-29 LAB — CBC
HCT: 30.9 % — ABNORMAL LOW (ref 39.0–52.0)
Hemoglobin: 10.7 g/dL — ABNORMAL LOW (ref 13.0–17.0)
MCH: 28.4 pg (ref 26.0–34.0)
MCHC: 34.6 g/dL (ref 30.0–36.0)
MCV: 82 fL (ref 78.0–100.0)
PLATELETS: 439 10*3/uL — AB (ref 150–400)
RBC: 3.77 MIL/uL — AB (ref 4.22–5.81)
RDW: 12.9 % (ref 11.5–15.5)
WBC: 13.8 10*3/uL — AB (ref 4.0–10.5)

## 2016-06-29 LAB — URINALYSIS, ROUTINE W REFLEX MICROSCOPIC
Bacteria, UA: NONE SEEN
Bilirubin Urine: NEGATIVE
GLUCOSE, UA: NEGATIVE mg/dL
Ketones, ur: NEGATIVE mg/dL
LEUKOCYTES UA: NEGATIVE
Nitrite: NEGATIVE
PROTEIN: NEGATIVE mg/dL
RBC / HPF: NONE SEEN RBC/hpf (ref 0–5)
Specific Gravity, Urine: 1.008 (ref 1.005–1.030)
Squamous Epithelial / LPF: NONE SEEN
pH: 6 (ref 5.0–8.0)

## 2016-06-29 LAB — TRIGLYCERIDES: Triglycerides: 111 mg/dL (ref <150)

## 2016-06-29 MED ORDER — OXYCODONE HCL 5 MG PO TABS
10.0000 mg | ORAL_TABLET | ORAL | Status: DC | PRN
  Administered 2016-06-29: 10 mg via ORAL
  Administered 2016-06-29: 15 mg via ORAL
  Administered 2016-06-29: 10 mg via ORAL
  Administered 2016-06-30: 15 mg via ORAL
  Administered 2016-06-30: 10 mg via ORAL
  Administered 2016-06-30 (×2): 15 mg via ORAL
  Administered 2016-06-30 (×2): 10 mg via ORAL
  Administered 2016-07-01 (×3): 15 mg via ORAL
  Filled 2016-06-29: qty 2
  Filled 2016-06-29 (×2): qty 3
  Filled 2016-06-29: qty 2
  Filled 2016-06-29: qty 3
  Filled 2016-06-29: qty 2
  Filled 2016-06-29 (×2): qty 3
  Filled 2016-06-29: qty 2
  Filled 2016-06-29 (×2): qty 3
  Filled 2016-06-29: qty 2

## 2016-06-29 MED ORDER — MORPHINE SULFATE (PF) 2 MG/ML IV SOLN
2.0000 mg | INTRAVENOUS | Status: DC | PRN
  Administered 2016-06-30 – 2016-07-01 (×2): 2 mg via INTRAVENOUS
  Filled 2016-06-29 (×3): qty 1

## 2016-06-29 NOTE — Progress Notes (Signed)
7 Days Post-Op  Subjective: Denies nausea appetite poor Pain well controlled  Objective: Vital signs in last 24 hours: Temp:  [98.2 F (36.8 C)-98.4 F (36.9 C)] 98.2 F (36.8 C) (02/24 0537) Pulse Rate:  [72-98] 72 (02/24 0537) Resp:  [16-18] 18 (02/24 0537) BP: (107-127)/(63-76) 111/69 (02/24 0537) SpO2:  [100 %] 100 % (02/24 0537) Last BM Date: 06/22/16  Intake/Output from previous day: 02/23 0701 - 02/24 0700 In: 2900.3 [P.O.:330; I.V.:2570.3] Out: 3300 [Urine:2900; Emesis/NG output:400] Intake/Output this shift: No intake/output data recorded.  Exam: Comfortable in appearance Abdomen soft VAC in place Ostomy viable  Lab Results:   Recent Labs  06/28/16 0519 06/29/16 0355  WBC 15.6* 13.8*  HGB 9.4* 10.7*  HCT 26.9* 30.9*  PLT 266 439*   BMET  Recent Labs  06/27/16 0414 06/29/16 0355  NA 133* 134*  K 3.2* 3.8  CL 97* 98*  CO2 26 26  GLUCOSE 90 122*  BUN 7 6  CREATININE 0.62 0.62  CALCIUM 8.6* 9.1   PT/INR No results for input(s): LABPROT, INR in the last 72 hours. ABG No results for input(s): PHART, HCO3 in the last 72 hours.  Invalid input(s): PCO2, PO2  Studies/Results: Dg Chest Port 1 View  Result Date: 06/28/2016 CLINICAL DATA:  Right pulmonary contusion EXAM: PORTABLE CHEST 1 VIEW COMPARISON:  06/23/2016 FINDINGS: Cardiomediastinal silhouette is stable. Endotracheal tube has been removed. Stable NG tube position. There is small right pleural effusion with blunting of the right costophrenic angle. No segmental infiltrate or pulmonary edema. No pneumothorax. IMPRESSION: Endotracheal tube has been removed. Stable NG tube position. No pneumothorax. Small right pleural effusion with blunting of the right costophrenic angle. No infiltrate or pulmonary edema. Electronically Signed   By: Lahoma Crocker M.D.   On: 06/28/2016 12:07    Anti-infectives: Anti-infectives    Start     Dose/Rate Route Frequency Ordered Stop   06/22/16 2245  cefoTEtan  (CEFOTAN) 2 g in dextrose 5 % 50 mL IVPB     2 g 100 mL/hr over 30 Minutes Intravenous To Surgery 06/22/16 2221 06/22/16 2258      Assessment/Plan: s/p Procedure(s): EXPLORATORY LAPAROTOMY (N/A)  Continue wound VAC Encourage po ambulate  LOS: 7 days    Kayleen Alig A 06/29/2016

## 2016-06-29 NOTE — Progress Notes (Addendum)
Trauma Service Note  Subjective: Patient sitting up in chair and looks good.  Complaining of pain with urination.  Objective: Vital signs in last 24 hours: Temp:  [98.2 F (36.8 C)-98.4 F (36.9 C)] 98.2 F (36.8 C) (02/24 0537) Pulse Rate:  [72-98] 72 (02/24 0537) Resp:  [16-18] 18 (02/24 0537) BP: (107-127)/(63-76) 111/69 (02/24 0537) SpO2:  [100 %] 100 % (02/24 0537) Last BM Date: 06/22/16  Intake/Output from previous day: 02/23 0701 - 02/24 0700 In: 2900.3 [P.O.:330; I.V.:2570.3] Out: 3300 [Urine:2900; Emesis/NG output:400] Intake/Output this shift: No intake/output data recorded.  General: No acute distress  Lungs: Clear to auscultation  Abd: Good bowel sounds.  NPWD intact.  Ostomy is putting out stool.  Extremities: No clinical signs or symptoms of DVT  Neuro: Intact  Lab Results: CBC   Recent Labs  06/28/16 0519 06/29/16 0355  WBC 15.6* 13.8*  HGB 9.4* 10.7*  HCT 26.9* 30.9*  PLT 266 439*   BMET  Recent Labs  06/27/16 0414 06/29/16 0355  NA 133* 134*  K 3.2* 3.8  CL 97* 98*  CO2 26 26  GLUCOSE 90 122*  BUN 7 6  CREATININE 0.62 0.62  CALCIUM 8.6* 9.1   PT/INR No results for input(s): LABPROT, INR in the last 72 hours. ABG No results for input(s): PHART, HCO3 in the last 72 hours.  Invalid input(s): PCO2, PO2  Studies/Results: Dg Chest Port 1 View  Result Date: 06/28/2016 CLINICAL DATA:  Right pulmonary contusion EXAM: PORTABLE CHEST 1 VIEW COMPARISON:  06/23/2016 FINDINGS: Cardiomediastinal silhouette is stable. Endotracheal tube has been removed. Stable NG tube position. There is small right pleural effusion with blunting of the right costophrenic angle. No segmental infiltrate or pulmonary edema. No pneumothorax. IMPRESSION: Endotracheal tube has been removed. Stable NG tube position. No pneumothorax. Small right pleural effusion with blunting of the right costophrenic angle. No infiltrate or pulmonary edema. Electronically Signed   By:  Lahoma Crocker M.D.   On: 06/28/2016 12:07    Anti-infectives: Anti-infectives    Start     Dose/Rate Route Frequency Ordered Stop   06/22/16 2245  cefoTEtan (CEFOTAN) 2 g in dextrose 5 % 50 mL IVPB     2 g 100 mL/hr over 30 Minutes Intravenous To Surgery 06/22/16 2221 06/22/16 2258      Assessment/Plan: s/p Procedure(s): EXPLORATORY LAPAROTOMY Advance diet Decrese IVFs  Check urine  LOS: 7 days   Kathryne Eriksson. Dahlia Bailiff, MD, FACS [phone] Trauma Surgeon 06/29/2016

## 2016-06-30 NOTE — Progress Notes (Signed)
Trauma Service Note  Subjective: Patient doing well.  Having bowel movements.  Doing well  Objective: Vital signs in last 24 hours: Temp:  [98.4 F (36.9 C)-98.6 F (37 C)] 98.6 F (37 C) (02/25 0554) Pulse Rate:  [85-100] 100 (02/25 0554) Resp:  [18] 18 (02/25 0554) BP: (115-125)/(62-69) 118/62 (02/25 0554) SpO2:  [100 %] 100 % (02/25 0554) Last BM Date: 06/29/16  Intake/Output from previous day: 02/24 0701 - 02/25 0700 In: 1344 [P.O.:342; I.V.:952; IV Piggyback:50] Out: 1200 [Urine:950; Stool:250] Intake/Output this shift: No intake/output data recorded.  General: No acute distress  Lungs: Clear  Abd: soft, good bowel sounds.  Extremities: No changes  Neuro: Intac  Lab Results: CBC   Recent Labs  06/28/16 0519 06/29/16 0355  WBC 15.6* 13.8*  HGB 9.4* 10.7*  HCT 26.9* 30.9*  PLT 266 439*   BMET  Recent Labs  06/29/16 0355  NA 134*  K 3.8  CL 98*  CO2 26  GLUCOSE 122*  BUN 6  CREATININE 0.62  CALCIUM 9.1   PT/INR No results for input(s): LABPROT, INR in the last 72 hours. ABG No results for input(s): PHART, HCO3 in the last 72 hours.  Invalid input(s): PCO2, PO2  Studies/Results: Dg Chest Port 1 View  Result Date: 06/28/2016 CLINICAL DATA:  Right pulmonary contusion EXAM: PORTABLE CHEST 1 VIEW COMPARISON:  06/23/2016 FINDINGS: Cardiomediastinal silhouette is stable. Endotracheal tube has been removed. Stable NG tube position. There is small right pleural effusion with blunting of the right costophrenic angle. No segmental infiltrate or pulmonary edema. No pneumothorax. IMPRESSION: Endotracheal tube has been removed. Stable NG tube position. No pneumothorax. Small right pleural effusion with blunting of the right costophrenic angle. No infiltrate or pulmonary edema. Electronically Signed   By: Lahoma Crocker M.D.   On: 06/28/2016 12:07    Anti-infectives: Anti-infectives    Start     Dose/Rate Route Frequency Ordered Stop   06/22/16 2245   cefoTEtan (CEFOTAN) 2 g in dextrose 5 % 50 mL IVPB     2 g 100 mL/hr over 30 Minutes Intravenous To Surgery 06/22/16 2221 06/22/16 2258      Assessment/Plan: s/p Procedure(s): [place] for discharge tomorrow  LOS: 8 days   Kathryne Eriksson. Dahlia Bailiff, MD, FACS [phone] Trauma Surgeon 06/30/2016

## 2016-07-01 ENCOUNTER — Encounter (HOSPITAL_COMMUNITY): Payer: Self-pay | Admitting: Emergency Medicine

## 2016-07-01 MED ORDER — OXYCODONE HCL 10 MG PO TABS: 10.0000 mg | ORAL_TABLET | ORAL | 0 refills | Status: DC | PRN

## 2016-07-01 NOTE — Care Management Note (Signed)
Case Management Note  Patient Details  Name: Nathan Vincent MRN: [id number] Date of Birth: July 31, 1995  Subjective/Objective:    Pt medically stable for discharge home today.  Pt to dc home with father and other family to assist at dc.  Pt will need HHRN to provide assistance with wound and colostomy care.  PT recommending cane, no OP services.                  Action/Plan: Referral to [facility] for [facility] follow up, per pt choice.  Pt states best contact number is his cell #, [phone].  Start of care 24-48h post dc date.  Referral to [facility] for DME needs; cane to be delivered to pt's room prior to dc.    Expected Discharge Date:  07/01/16               Expected Discharge Plan:  [place]  In-House Referral:  Clinical Social Work  Discharge planning Services  CM Consult  Post Acute Care Choice:  Home Health Choice offered to:  Patient  DME Arranged:  Kasandra Knudsen DME Agency:  [place] Arranged:  RN [facility] Agency:  [place]  Status of Service:  Completed, signed off  If discussed at [place] of Stay Meetings, dates discussed:    Additional Comments:  Reinaldo Raddle, RN, BSN  Trauma/Neuro ICU Case Manager [phone]

## 2016-07-01 NOTE — Discharge Instructions (Signed)
[facility]      [facility] °[phone] ° °OPEN ABDOMINAL SURGERY: POST OP INSTRUCTIONS ° °Always review your discharge instruction sheet given to you by the facility where your surgery was performed. ° °IF YOU HAVE DISABILITY OR FAMILY LEAVE FORMS, YOU MUST BRING THEM TO THE OFFICE FOR PROCESSING.  PLEASE DO NOT GIVE THEM TO YOUR DOCTOR. ° °1. A prescription for pain medication may be given to you upon discharge.  Take your pain medication as prescribed, if needed.  If narcotic pain medicine is not needed, then you may take acetaminophen (Tylenol) or ibuprofen (Advil) as needed. °2. Take your usually prescribed medications unless otherwise directed. °3. If you need a refill on your pain medication, please contact your pharmacy. They will contact our office to request authorization.  Prescriptions will not be filled after 5pm or on week-ends. °4. You should follow a light diet the first few days after arrival home, such as soup and crackers, pudding, etc.unless your doctor has advised otherwise. A high-fiber, low fat diet can be resumed as tolerated.   Be sure to include lots of fluids daily. Most patients will experience some swelling and bruising on the chest and neck area.  Ice packs will help.  Swelling and bruising can take several days to resolve °5. Most patients will experience some swelling and bruising in the area of the incision. Ice pack will help. Swelling and bruising can take several days to resolve..  °6. It is common to experience some constipation if taking pain medication after surgery.  Increasing fluid intake and taking a stool softener will usually help or prevent this problem from occurring.  A mild laxative (Milk of Magnesia or Miralax) should be taken according to package directions if there are no bowel movements after 48 hours. °7.  You may have steri-strips (small skin tapes) in place directly over the incision.  These strips should be left on the skin for 7-10 days.  If your  surgeon used skin glue on the incision, you may shower in 24 hours.  The glue will flake off over the next 2-3 weeks.  Any sutures or staples will be removed at the office during your follow-up visit. You may find that a light gauze bandage over your incision may keep your staples from being rubbed or pulled. You may shower and replace the bandage daily. °8. ACTIVITIES:  You may resume regular (light) daily activities beginning the next day--such as daily self-care, walking, climbing stairs--gradually increasing activities as tolerated.  You may have sexual intercourse when it is comfortable.  Refrain from any heavy lifting or straining until approved by your doctor. °a. You may drive when you no longer are taking prescription pain medication, you can comfortably wear a seatbelt, and you can safely maneuver your car and apply brakes °b. Return to Work: ___________________________________ °9. You should see your doctor in the office for a follow-up appointment approximately two weeks after your surgery.  Make sure that you call for this appointment within a day or two after you arrive home to insure a convenient appointment time. °OTHER INSTRUCTIONS:  °_____________________________________________________________ °_____________________________________________________________ ° °WHEN TO CALL YOUR DOCTOR: °1. Fever over 101.0 °2. Inability to urinate °3. Nausea and/or vomiting °4. Extreme swelling or bruising °5. Continued bleeding from incision. °6. Increased pain, redness, or drainage from the incision. °7. Difficulty swallowing or breathing °8. Muscle cramping or spasms. °9. Numbness or tingling in hands or feet or around lips. ° °The clinic staff is available to   answer your questions during regular business hours.  Please dont hesitate to call and ask to speak to one of the nurses if you have concerns.  For further questions, please visit [email or web address]    MIDLINE WOUND CARE: - midline  dressing to be changed twice daily - supplies: sterile saline, kerlix, scissors, ABD pads, tape  - remove dressing and all packing carefully, moistening with sterile saline as needed to avoid packing/internal dressing sticking to the wound. - clean edges of skin around the wound with water/gauze, making sure there is no tape debris or leakage left on skin that could cause skin irritation or breakdown. - dampen a clean kerlix with sterile saline and pack wound from wound base to skin level, making sure to take note of any possible areas of wound tracking, tunneling and packing appropriately. Wound can be packed loosely. Trim kerlix to size if a whole kerlix is not required. - cover wound with a dry ABD pad and secure with tape.  - write the date/time on the dry dressing/tape to better track when the last dressing change occurred. - apply any skin protectant/powder recommended by clinician to protect skin/skin folds. - change dressing as needed if leakage occurs, wound gets contaminated, or patient requests to shower. - patient may shower daily with wound open and following the shower the wound should be dried and a clean dressing placed.     Colostomy, Adult, Care After Refer to this sheet in the next few weeks. These instructions provide you with information about caring for yourself after your procedure. Your health care provider may also give you more specific instructions. Your treatment has been planned according to current medical practices, but problems sometimes occur. Call your health care provider if you have any problems or questions after your procedure. What can I expect after the procedure? After the procedure, it is common to have:  Swelling at the opening that was created during the procedure (stoma).  Slight bleeding around the stoma.  Redness around the stoma. Follow these instructions at home: Activity  Rest as needed while the stoma area heals.  Return to your normal  activities as told by your health care provider. Ask your health care provider what activities are safe for you.  Avoid strenuous activity and abdominal exercises for 3 weeks or for as long as told by your health care provider.  Do not lift anything that is heavier than 10 lb (4.5 kg). Incision care  Follow instructions from your health care provider about how to take care of your incision. Make sure you:  Wash your hands with soap and water before you change your bandage (dressing). If soap and water are not available, use hand sanitizer.  Change your dressing as told by your health care provider.  Leave stitches (sutures), skin glue, or adhesive strips in place. These skin closures may need to stay in place for 2 weeks or longer. If adhesive strip edges start to loosen and curl up, you may trim the loose edges. Do not remove adhesive strips completely unless your health care provider tells you to do that. Stoma Care  Keep the stoma area clean.  Clean and dry the skin around the stoma each time you change the colostomy bag. To clean the stoma area:  Use warm water and only use cleansers that are recommended by your health care provider.  Rinse the stoma area with plain water.  Dry the area well.  Use stoma powder or ointment on your skin only  as told by your health care provider. Do not use any other powders, gels, wipes, or creams on your skin.  Check the stoma area every day for signs of infection. Check for:  More redness, swelling, or pain.  More fluid or blood.  Pus or warmth.  Measure the stoma opening regularly and record the size. Watch for changes. Share this information with your health care provider. Bathing  Do not take baths, swim, or use a hot tub until your health care provider approves. Ask your health care provider if you can take showers. You may be able to shower with or without the colostomy bag in place. If you bathe with the bag on, dry the bag  afterward.  Avoid using harsh or oily soaps when you bathe. Colostomy Bag Care  Follow instructions from your health care provider about how to empty or change the colostomy bag.  Keep colostomy supplies with you at all times.  Store all supplies in a cool, dry place.  Empty the colostomy bag:  Whenever it is one-third to one-half full.  At bedtime.  Replace the bag every 2-4 days or as told by your health care provider. Driving  Do not drive for 24 hours if you received a sedative.  Do not drive or operate heavy machinery while taking prescription pain medicine. General instructions  Follow instructions from your health care provider about eating or drinking restrictions.  Take over-the-counter and prescription medicines only as told by your health care provider.  Avoid wearing clothes that are tight directly over your stoma.  Do not use any tobacco products, such as cigarettes, chewing tobacco, and e-cigarettes. If you need help quitting, ask your health care provider.  (Women) Ask your health care provider about becoming pregnant and about using birth control. Medicines may not be absorbed normally after the procedure.  Keep all follow-up visits as told by your health care provider. This is important. Contact a health care provider if:  You are having trouble caring for your stoma or changing the colostomy bag.  You feel nauseous or you vomit.  You have a fever.  You havemore redness, swelling, or pain at the site of your stoma or around your anus.  You have more fluid or blood coming from your stoma or your anus.  Your stoma area feels warm to the touch.  You have pus coming from your stoma.  You notice a change in the size or appearance of the stoma.  You have abdominal pain, bloating, pressure, or cramping.  Your have stool more often or less often than your health care provider tells you to expect.  You are not making much urine. This may be a sign of  dehydration. Get help right away if:  Your abdominal pain does not go away or it becomes severe.  You keep vomiting.  Your stool is not draining through the stoma.  You have chest pain or an irregular heartbeat. This information is not intended to replace advice given to you by your health care provider. Make sure you discuss any questions you have with your health care provider. Document Released: 09/12/2010 Document Revised: 08/31/2015 Document Reviewed: 01/03/2015 Elsevier Interactive Patient Education  2017 [employer].

## 2016-07-01 NOTE — Progress Notes (Signed)
Discharge home. Home discharge instruction given, no queston verbalized. HHneeds addressed by the CM.
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
## 2016-07-03 ENCOUNTER — Telehealth (HOSPITAL_COMMUNITY): Payer: Self-pay

## 2016-07-03 ENCOUNTER — Telehealth (HOSPITAL_COMMUNITY): Payer: Self-pay | Admitting: Psychiatry

## 2016-07-03 ENCOUNTER — Other Ambulatory Visit (HOSPITAL_COMMUNITY): Payer: Self-pay

## 2016-07-03 DIAGNOSIS — F900 Attention-deficit hyperactivity disorder, predominantly inattentive type: Secondary | ICD-10-CM

## 2016-07-03 MED ORDER — AMPHETAMINE-DEXTROAMPHET ER 20 MG PO CP24: 20.0000 mg | ORAL_CAPSULE | Freq: Two times a day (BID) | ORAL | 0 refills | Status: DC

## 2016-07-03 NOTE — Telephone Encounter (Signed)
Karla picked up prescription on [id number] lic [id number] dlo

## 2016-07-04 ENCOUNTER — Encounter (HOSPITAL_COMMUNITY): Payer: Self-pay | Admitting: Psychiatry

## 2016-07-04 ENCOUNTER — Telehealth (HOSPITAL_COMMUNITY): Payer: Self-pay

## 2016-07-04 DIAGNOSIS — S3719XD Other injury of ureter, subsequent encounter: Secondary | ICD-10-CM | POA: Diagnosis not present

## 2016-07-04 DIAGNOSIS — Z433 Encounter for attention to colostomy: Secondary | ICD-10-CM | POA: Diagnosis not present

## 2016-07-04 DIAGNOSIS — Z48815 Encounter for surgical aftercare following surgery on the digestive system: Secondary | ICD-10-CM | POA: Diagnosis not present

## 2016-07-04 DIAGNOSIS — S71142D Puncture wound with foreign body, left thigh, subsequent encounter: Secondary | ICD-10-CM | POA: Diagnosis not present

## 2016-07-04 DIAGNOSIS — S21249D Puncture wound with foreign body of unspecified back wall of thorax without penetration into thoracic cavity, subsequent encounter: Secondary | ICD-10-CM | POA: Diagnosis not present

## 2016-07-04 DIAGNOSIS — F1290 Cannabis use, unspecified, uncomplicated: Secondary | ICD-10-CM | POA: Diagnosis not present

## 2016-07-04 DIAGNOSIS — S31140A Puncture wound of abdominal wall with foreign body, right upper quadrant without penetration into peritoneal cavity, initial encounter: Secondary | ICD-10-CM | POA: Diagnosis not present

## 2016-07-04 NOTE — Telephone Encounter (Signed)
Called [facility] to get an authorization for patients Adderall 20 mg BID. I s/w Daryl and the request is currently under review. Ref# is [id number] - they will notify us within 24 - 48 hours. I called patient to let him know.
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
## 2016-07-05 ENCOUNTER — Telehealth (HOSPITAL_COMMUNITY): Payer: Self-pay

## 2016-07-05 NOTE — Telephone Encounter (Signed)
Returned call to patient. States that he vomited after eating a granola bar and a smoothie this morning. Denies fever or chills. States that his colostomy is still producing air and stool. His abdomen does feel a little more bloated. Since this episode of emesis he has been able to tolerate water and gingerale. Advised that I will call in rx of zofran to his pharmacy. Recommend staying on a bland/liquid diet (ie broth, Ensure, jello...), and take in small amounts at a time. Encouraged ambulation. Patient will call back if n/v persists, and/or if colostomy stops having output. No further needs at this time.

## 2016-07-08 ENCOUNTER — Telehealth (HOSPITAL_COMMUNITY): Payer: Self-pay

## 2016-07-08 DIAGNOSIS — Z48815 Encounter for surgical aftercare following surgery on the digestive system: Secondary | ICD-10-CM | POA: Diagnosis not present

## 2016-07-08 DIAGNOSIS — S31140A Puncture wound of abdominal wall with foreign body, right upper quadrant without penetration into peritoneal cavity, initial encounter: Secondary | ICD-10-CM | POA: Diagnosis not present

## 2016-07-08 DIAGNOSIS — S21249D Puncture wound with foreign body of unspecified back wall of thorax without penetration into thoracic cavity, subsequent encounter: Secondary | ICD-10-CM | POA: Diagnosis not present

## 2016-07-08 DIAGNOSIS — S3719XD Other injury of ureter, subsequent encounter: Secondary | ICD-10-CM | POA: Diagnosis not present

## 2016-07-08 DIAGNOSIS — F1290 Cannabis use, unspecified, uncomplicated: Secondary | ICD-10-CM | POA: Diagnosis not present

## 2016-07-08 DIAGNOSIS — S71142D Puncture wound with foreign body, left thigh, subsequent encounter: Secondary | ICD-10-CM | POA: Diagnosis not present

## 2016-07-08 DIAGNOSIS — Z433 Encounter for attention to colostomy: Secondary | ICD-10-CM | POA: Diagnosis not present

## 2016-07-08 NOTE — Telephone Encounter (Signed)
Fax received this morning from [employer], patients Adderall was approved. Auth# [id number]. I called patient and the pharmacy to let them know.

## 2016-07-09 ENCOUNTER — Encounter (HOSPITAL_COMMUNITY): Payer: Self-pay | Admitting: Emergency Medicine

## 2016-07-09 ENCOUNTER — Emergency Department (HOSPITAL_COMMUNITY)
Admission: EM | Admit: 2016-07-09 | Discharge: 2016-07-10 | Disposition: A | Discharge status: Home / Self Care | Payer: BLUE CROSS/BLUE SHIELD | Attending: Emergency Medicine | Admitting: Emergency Medicine

## 2016-07-09 ENCOUNTER — Telehealth (HOSPITAL_COMMUNITY): Payer: Self-pay

## 2016-07-09 DIAGNOSIS — D473 Essential (hemorrhagic) thrombocythemia: Secondary | ICD-10-CM | POA: Insufficient documentation

## 2016-07-09 DIAGNOSIS — R1084 Generalized abdominal pain: Secondary | ICD-10-CM | POA: Diagnosis not present

## 2016-07-09 DIAGNOSIS — R748 Abnormal levels of other serum enzymes: Secondary | ICD-10-CM | POA: Diagnosis not present

## 2016-07-09 DIAGNOSIS — R112 Nausea with vomiting, unspecified: Secondary | ICD-10-CM | POA: Insufficient documentation

## 2016-07-09 DIAGNOSIS — R1110 Vomiting, unspecified: Secondary | ICD-10-CM | POA: Diagnosis not present

## 2016-07-09 DIAGNOSIS — F909 Attention-deficit hyperactivity disorder, unspecified type: Secondary | ICD-10-CM | POA: Diagnosis not present

## 2016-07-09 DIAGNOSIS — F17200 Nicotine dependence, unspecified, uncomplicated: Secondary | ICD-10-CM | POA: Diagnosis not present

## 2016-07-09 DIAGNOSIS — E860 Dehydration: Secondary | ICD-10-CM | POA: Insufficient documentation

## 2016-07-09 DIAGNOSIS — R809 Proteinuria, unspecified: Secondary | ICD-10-CM | POA: Diagnosis not present

## 2016-07-09 DIAGNOSIS — D75839 Thrombocytosis, unspecified: Secondary | ICD-10-CM

## 2016-07-09 LAB — URINALYSIS, ROUTINE W REFLEX MICROSCOPIC
BACTERIA UA: NONE SEEN
Bilirubin Urine: NEGATIVE
Glucose, UA: NEGATIVE mg/dL
HGB URINE DIPSTICK: NEGATIVE
Ketones, ur: NEGATIVE mg/dL
Leukocytes, UA: NEGATIVE
NITRITE: NEGATIVE
Protein, ur: 30 mg/dL — AB
SPECIFIC GRAVITY, URINE: 1.019 (ref 1.005–1.030)
SQUAMOUS EPITHELIAL / LPF: NONE SEEN
WBC UA: NONE SEEN WBC/hpf (ref 0–5)
pH: 8 (ref 5.0–8.0)

## 2016-07-09 LAB — CBC WITH DIFFERENTIAL/PLATELET
BASOS PCT: 0 %
Basophils Absolute: 0.1 10*3/uL (ref 0.0–0.1)
Eosinophils Absolute: 0.2 10*3/uL (ref 0.0–0.7)
Eosinophils Relative: 2 %
HEMATOCRIT: 33.5 % — AB (ref 39.0–52.0)
HEMOGLOBIN: 11 g/dL — AB (ref 13.0–17.0)
LYMPHS ABS: 2.6 10*3/uL (ref 0.7–4.0)
Lymphocytes Relative: 22 %
MCH: 28.1 pg (ref 26.0–34.0)
MCHC: 32.8 g/dL (ref 30.0–36.0)
MCV: 85.5 fL (ref 78.0–100.0)
MONO ABS: 0.7 10*3/uL (ref 0.1–1.0)
MONOS PCT: 6 %
NEUTROS ABS: 8.7 10*3/uL — AB (ref 1.7–7.7)
Neutrophils Relative %: 71 %
Platelets: 990 10*3/uL (ref 150–400)
RBC: 3.92 MIL/uL — ABNORMAL LOW (ref 4.22–5.81)
RDW: 14.1 % (ref 11.5–15.5)
WBC: 12.2 10*3/uL — ABNORMAL HIGH (ref 4.0–10.5)

## 2016-07-09 LAB — BASIC METABOLIC PANEL
Anion gap: 11 (ref 5–15)
BUN: 9 mg/dL (ref 6–20)
CALCIUM: 9.4 mg/dL (ref 8.9–10.3)
CO2: 27 mmol/L (ref 22–32)
CREATININE: 0.85 mg/dL (ref 0.61–1.24)
Chloride: 99 mmol/L — ABNORMAL LOW (ref 101–111)
GFR calc non Af Amer: >60 mL/min (ref >60)
GLUCOSE: 102 mg/dL — AB (ref 65–99)
Potassium: 4 mmol/L (ref 3.5–5.1)
Sodium: 137 mmol/L (ref 135–145)

## 2016-07-09 MED ORDER — ONDANSETRON HCL 4 MG/2ML IJ SOLN
4.0000 mg | Freq: Once | INTRAMUSCULAR | Status: AC | PRN
Start: 2016-07-09 — End: 2016-07-09
  Administered 2016-07-09: 4 mg via INTRAVENOUS
  Filled 2016-07-09: qty 2

## 2016-07-09 MED ORDER — SODIUM CHLORIDE 0.9 % IV BOLUS (SEPSIS)
1000.0000 mL | Freq: Once | INTRAVENOUS | Status: AC
  Administered 2016-07-09: 1000 mL via INTRAVENOUS

## 2016-07-09 NOTE — Telephone Encounter (Signed)
Refilled patient pain medication at lower dose- discussed, again, the importance of weaning narcotic meds. Counseled him to try to take tylenol during the day reserve oxycodone for dressing changes or when his pain is worse at night. Patient reports he has still been taking oxycodone every 4 hours.   Will discuss pain control in more detail at his Thursday appointment.

## 2016-07-09 NOTE — Telephone Encounter (Signed)
Attempted to return patient call twice, no answer.  Will try again later. His pain medication was refilled on 07/04/16. He has a appointment at our trauma office this Thursday 07/11/16.

## 2016-07-09 NOTE — ED Notes (Signed)
Pt reports he has been vomiting since yesterday. Doctor called in oral Zofran but pt cannot keep same down.

## 2016-07-09 NOTE — ED Triage Notes (Signed)
Pt states he is not able to keep any food or drinks down since yesterday denies any fever or chill having 6/10 abd pain at this time.

## 2016-07-10 ENCOUNTER — Encounter (HOSPITAL_COMMUNITY): Payer: Self-pay | Admitting: *Deleted

## 2016-07-10 ENCOUNTER — Emergency Department (HOSPITAL_COMMUNITY): Payer: BLUE CROSS/BLUE SHIELD

## 2016-07-10 DIAGNOSIS — R112 Nausea with vomiting, unspecified: Secondary | ICD-10-CM | POA: Diagnosis not present

## 2016-07-10 DIAGNOSIS — R1110 Vomiting, unspecified: Secondary | ICD-10-CM | POA: Diagnosis not present

## 2016-07-10 LAB — HEPATIC FUNCTION PANEL
ALBUMIN: 3.4 g/dL — AB (ref 3.5–5.0)
ALK PHOS: 81 U/L (ref 38–126)
ALT: 17 U/L (ref 17–63)
AST: 18 U/L (ref 15–41)
Bilirubin, Direct: 0.3 mg/dL (ref 0.1–0.5)
Indirect Bilirubin: 0.4 mg/dL (ref 0.3–0.9)
Total Bilirubin: 0.7 mg/dL (ref 0.3–1.2)
Total Protein: 7.3 g/dL (ref 6.5–8.1)

## 2016-07-10 LAB — LIPASE, BLOOD: LIPASE: 114 U/L — AB (ref 11–51)

## 2016-07-10 LAB — I-STAT CG4 LACTIC ACID, ED: Lactic Acid, Venous: 0.84 mmol/L (ref 0.5–1.9)

## 2016-07-10 MED ORDER — IOPAMIDOL (ISOVUE-300) INJECTION 61%
INTRAVENOUS | Status: AC
  Administered 2016-07-10: 100 mL
  Filled 2016-07-10: qty 100

## 2016-07-10 MED ORDER — ONDANSETRON 4 MG PO TBDP: ORAL_TABLET | ORAL | 0 refills | Status: DC

## 2016-07-10 MED ORDER — SODIUM CHLORIDE 0.9 % IV BOLUS (SEPSIS)
1000.0000 mL | Freq: Once | INTRAVENOUS | Status: AC
  Administered 2016-07-10: 1000 mL via INTRAVENOUS

## 2016-07-10 MED ORDER — MORPHINE SULFATE (PF) 4 MG/ML IV SOLN
4.0000 mg | Freq: Once | INTRAVENOUS | Status: AC
  Administered 2016-07-10: 4 mg via INTRAVENOUS
  Filled 2016-07-10: qty 1

## 2016-07-10 MED ORDER — ONDANSETRON HCL 4 MG/2ML IJ SOLN
4.0000 mg | Freq: Once | INTRAMUSCULAR | Status: AC
  Administered 2016-07-10: 4 mg via INTRAVENOUS
  Filled 2016-07-10: qty 2

## 2016-07-10 NOTE — ED Notes (Signed)
Patient left at this time with all belongings. Refused wheelchair
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
## 2016-07-10 NOTE — ED Notes (Signed)
Given PO challenge with room temperature water, asked pt to let us know if nausea comes back.

## 2016-07-11 DIAGNOSIS — S31609A Unspecified open wound of abdominal wall, unspecified quadrant with penetration into peritoneal cavity, initial encounter: Secondary | ICD-10-CM | POA: Diagnosis not present

## 2016-07-11 LAB — URINE CULTURE: Culture: NO GROWTH

## 2016-07-11 LAB — PATHOLOGIST SMEAR REVIEW

## 2016-07-15 DIAGNOSIS — Z48815 Encounter for surgical aftercare following surgery on the digestive system: Secondary | ICD-10-CM | POA: Diagnosis not present

## 2016-07-15 DIAGNOSIS — S71142D Puncture wound with foreign body, left thigh, subsequent encounter: Secondary | ICD-10-CM | POA: Diagnosis not present

## 2016-07-15 DIAGNOSIS — S31140A Puncture wound of abdominal wall with foreign body, right upper quadrant without penetration into peritoneal cavity, initial encounter: Secondary | ICD-10-CM | POA: Diagnosis not present

## 2016-07-15 DIAGNOSIS — S3719XD Other injury of ureter, subsequent encounter: Secondary | ICD-10-CM | POA: Diagnosis not present

## 2016-07-15 DIAGNOSIS — Z433 Encounter for attention to colostomy: Secondary | ICD-10-CM | POA: Diagnosis not present

## 2016-07-15 DIAGNOSIS — F1290 Cannabis use, unspecified, uncomplicated: Secondary | ICD-10-CM | POA: Diagnosis not present

## 2016-07-15 DIAGNOSIS — S21249D Puncture wound with foreign body of unspecified back wall of thorax without penetration into thoracic cavity, subsequent encounter: Secondary | ICD-10-CM | POA: Diagnosis not present

## 2016-07-19 ENCOUNTER — Telehealth (HOSPITAL_COMMUNITY): Payer: Self-pay

## 2016-07-19 NOTE — Telephone Encounter (Signed)
Spoke with the patient's father reports the patient is having persistent issues with nausea and vomiting. He states that the patient is keeping liquids down however when he eats solid food he vomits following day. States his son is not vomiting immediately following meals, afebrile, and is still having regular colostomy output. Reviewed patient's most recent CT scan from the emergency department and there are no acute intra-abdominal issues such as bowel obstruction or ileus.   I advised patient's father that if he is unable to keep fluids down he should go to the emergency department for IV hydration and a repeat workup. He should also report to the emergency room if the patient developed severe abdominal pain or stops having colostomy output. I advised 10 to make sure that Brandis is sitting up for at least 30 minutes following meals to avoid reflux. I also told him to ask Roye about gastric reflux symptoms and advised him to see if a H2 blocker medication such as Zantac or Pepcid relieve his symptoms.

## 2016-07-19 NOTE — Telephone Encounter (Signed)
Dad [phone] called son is still not keeping anything down, is throwing up anything he eats.  Michela Pitcher he was in the ED last week for same problem and it is not better.

## 2016-07-22 ENCOUNTER — Emergency Department (HOSPITAL_COMMUNITY)
Admission: EM | Admit: 2016-07-22 | Discharge: 2016-07-22 | Disposition: A | Discharge status: Home / Self Care | Payer: BLUE CROSS/BLUE SHIELD | Attending: Emergency Medicine | Admitting: Emergency Medicine

## 2016-07-22 ENCOUNTER — Encounter (HOSPITAL_COMMUNITY): Payer: Self-pay | Admitting: Emergency Medicine

## 2016-07-22 DIAGNOSIS — F909 Attention-deficit hyperactivity disorder, unspecified type: Secondary | ICD-10-CM | POA: Diagnosis not present

## 2016-07-22 DIAGNOSIS — R112 Nausea with vomiting, unspecified: Secondary | ICD-10-CM | POA: Insufficient documentation

## 2016-07-22 DIAGNOSIS — R627 Adult failure to thrive: Secondary | ICD-10-CM | POA: Diagnosis not present

## 2016-07-22 DIAGNOSIS — F319 Bipolar disorder, unspecified: Secondary | ICD-10-CM | POA: Diagnosis not present

## 2016-07-22 DIAGNOSIS — F17200 Nicotine dependence, unspecified, uncomplicated: Secondary | ICD-10-CM | POA: Insufficient documentation

## 2016-07-22 DIAGNOSIS — F1910 Other psychoactive substance abuse, uncomplicated: Secondary | ICD-10-CM | POA: Diagnosis not present

## 2016-07-22 DIAGNOSIS — Z79899 Other long term (current) drug therapy: Secondary | ICD-10-CM | POA: Diagnosis not present

## 2016-07-22 DIAGNOSIS — Z933 Colostomy status: Secondary | ICD-10-CM | POA: Diagnosis not present

## 2016-07-22 HISTORY — DX: Accidental discharge from unspecified firearms or gun, initial encounter: W34.00XA

## 2016-07-22 LAB — CBC
HEMATOCRIT: 39.8 % (ref 39.0–52.0)
HEMOGLOBIN: 13.1 g/dL (ref 13.0–17.0)
MCH: 28.2 pg (ref 26.0–34.0)
MCHC: 32.9 g/dL (ref 30.0–36.0)
MCV: 85.8 fL (ref 78.0–100.0)
Platelets: 507 10*3/uL — ABNORMAL HIGH (ref 150–400)
RBC: 4.64 MIL/uL (ref 4.22–5.81)
RDW: 14.3 % (ref 11.5–15.5)
WBC: 9.2 10*3/uL (ref 4.0–10.5)

## 2016-07-22 LAB — COMPREHENSIVE METABOLIC PANEL
ALBUMIN: 4 g/dL (ref 3.5–5.0)
ALT: 11 U/L — ABNORMAL LOW (ref 17–63)
AST: 15 U/L (ref 15–41)
Alkaline Phosphatase: 68 U/L (ref 38–126)
Anion gap: 9 (ref 5–15)
BILIRUBIN TOTAL: 0.5 mg/dL (ref 0.3–1.2)
BUN: 7 mg/dL (ref 6–20)
CHLORIDE: 96 mmol/L — AB (ref 101–111)
CO2: 31 mmol/L (ref 22–32)
Calcium: 9.3 mg/dL (ref 8.9–10.3)
Creatinine, Ser: 0.99 mg/dL (ref 0.61–1.24)
GFR calc Af Amer: >60 mL/min (ref >60)
GFR calc non Af Amer: >60 mL/min (ref >60)
GLUCOSE: 90 mg/dL (ref 65–99)
Potassium: 4.4 mmol/L (ref 3.5–5.1)
SODIUM: 136 mmol/L (ref 135–145)
Total Protein: 7.6 g/dL (ref 6.5–8.1)

## 2016-07-22 LAB — I-STAT CG4 LACTIC ACID, ED: Lactic Acid, Venous: 1.15 mmol/L (ref 0.5–1.9)

## 2016-07-22 LAB — LIPASE, BLOOD: LIPASE: 34 U/L (ref 11–51)

## 2016-07-22 MED ORDER — SODIUM CHLORIDE 0.9 % IV SOLN: INTRAVENOUS | Status: DC

## 2016-07-22 MED ORDER — IOPAMIDOL (ISOVUE-300) INJECTION 61%
INTRAVENOUS | Status: AC
  Filled 2016-07-22: qty 100

## 2016-07-22 MED ORDER — SODIUM CHLORIDE 0.9 % IV BOLUS (SEPSIS)
1000.0000 mL | Freq: Once | INTRAVENOUS | Status: AC
  Administered 2016-07-22: 1000 mL via INTRAVENOUS

## 2016-07-22 MED ORDER — ONDANSETRON HCL 4 MG/2ML IJ SOLN
4.0000 mg | Freq: Once | INTRAMUSCULAR | Status: AC
  Administered 2016-07-22: 4 mg via INTRAVENOUS
  Filled 2016-07-22: qty 2

## 2016-07-22 MED ORDER — ONDANSETRON 4 MG PO TBDP: 4.0000 mg | ORAL_TABLET | Freq: Three times a day (TID) | ORAL | 2 refills | Status: DC | PRN

## 2016-07-22 NOTE — ED Notes (Signed)
ED Provider at bedside. 
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
## 2016-07-22 NOTE — ED Notes (Signed)
Explained to pt need for urine sample for UA. Pt verbalized understanding but reports he does not have to void at this time but he will try shortly.
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
## 2016-07-25 ENCOUNTER — Other Ambulatory Visit (HOSPITAL_COMMUNITY): Payer: Self-pay | Admitting: General Surgery

## 2016-07-25 DIAGNOSIS — Z9889 Other specified postprocedural states: Secondary | ICD-10-CM

## 2016-07-30 ENCOUNTER — Ambulatory Visit (HOSPITAL_COMMUNITY): Admission: RE | Admit: 2016-07-30 | Payer: No Typology Code available for payment source | Source: Ambulatory Visit

## 2016-08-01 ENCOUNTER — Ambulatory Visit (HOSPITAL_COMMUNITY): Payer: No Typology Code available for payment source

## 2016-08-07 ENCOUNTER — Ambulatory Visit (HOSPITAL_COMMUNITY)
Admission: RE | Admit: 2016-08-07 | Discharge: 2016-08-07 | Disposition: A | Discharge status: Home / Self Care | Payer: BLUE CROSS/BLUE SHIELD | Source: Ambulatory Visit | Attending: General Surgery | Admitting: General Surgery

## 2016-08-07 DIAGNOSIS — K6389 Other specified diseases of intestine: Secondary | ICD-10-CM | POA: Insufficient documentation

## 2016-08-07 DIAGNOSIS — R112 Nausea with vomiting, unspecified: Secondary | ICD-10-CM | POA: Diagnosis not present

## 2016-08-07 DIAGNOSIS — Z9889 Other specified postprocedural states: Secondary | ICD-10-CM | POA: Diagnosis not present

## 2016-08-12 DIAGNOSIS — S31609A Unspecified open wound of abdominal wall, unspecified quadrant with penetration into peritoneal cavity, initial encounter: Secondary | ICD-10-CM | POA: Diagnosis not present

## 2016-08-13 ENCOUNTER — Other Ambulatory Visit (HOSPITAL_COMMUNITY): Payer: Self-pay

## 2016-08-14 ENCOUNTER — Other Ambulatory Visit (HOSPITAL_COMMUNITY): Payer: Self-pay

## 2016-08-14 ENCOUNTER — Telehealth (HOSPITAL_COMMUNITY): Payer: Self-pay

## 2016-08-14 DIAGNOSIS — F900 Attention-deficit hyperactivity disorder, predominantly inattentive type: Secondary | ICD-10-CM

## 2016-08-14 MED ORDER — AMPHETAMINE-DEXTROAMPHET ER 20 MG PO CP24: 20.0000 mg | ORAL_CAPSULE | Freq: Two times a day (BID) | ORAL | 0 refills | Status: DC

## 2016-08-14 NOTE — Telephone Encounter (Signed)
Patient came by because he is out of his Adderall, he has an appointment tomorrow but is out today. I spoke with Dr. Lovena Le who agreed to write a 30 day prescription. I called patient to come and pick up and reminded him that he needs to be here tomorrow at 3 pm. Patient stated he will not forget.

## 2016-08-15 ENCOUNTER — Encounter (HOSPITAL_COMMUNITY): Payer: Self-pay | Admitting: Psychiatry

## 2016-08-15 ENCOUNTER — Ambulatory Visit (INDEPENDENT_AMBULATORY_CARE_PROVIDER_SITE_OTHER): Payer: BLUE CROSS/BLUE SHIELD | Admitting: Psychiatry

## 2016-08-15 VITALS — BP 104/70 | HR 100 | Ht 72.0 in | Wt 130.0 lb

## 2016-08-15 DIAGNOSIS — F1290 Cannabis use, unspecified, uncomplicated: Secondary | ICD-10-CM

## 2016-08-15 DIAGNOSIS — F4310 Post-traumatic stress disorder, unspecified: Secondary | ICD-10-CM

## 2016-08-15 DIAGNOSIS — Z79899 Other long term (current) drug therapy: Secondary | ICD-10-CM | POA: Diagnosis not present

## 2016-08-15 DIAGNOSIS — F330 Major depressive disorder, recurrent, mild: Secondary | ICD-10-CM

## 2016-08-15 DIAGNOSIS — F4010 Social phobia, unspecified: Secondary | ICD-10-CM | POA: Diagnosis not present

## 2016-08-15 DIAGNOSIS — F900 Attention-deficit hyperactivity disorder, predominantly inattentive type: Secondary | ICD-10-CM

## 2016-08-15 DIAGNOSIS — F17210 Nicotine dependence, cigarettes, uncomplicated: Secondary | ICD-10-CM

## 2016-08-15 MED ORDER — AMPHETAMINE-DEXTROAMPHET ER 20 MG PO CP24: 20.0000 mg | ORAL_CAPSULE | Freq: Two times a day (BID) | ORAL | 0 refills | Status: DC
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
## 2016-08-19 ENCOUNTER — Emergency Department (HOSPITAL_COMMUNITY)
Admission: EM | Admit: 2016-08-19 | Discharge: 2016-08-19 | Disposition: A | Discharge status: Home / Self Care | Payer: BLUE CROSS/BLUE SHIELD | Attending: Emergency Medicine | Admitting: Emergency Medicine

## 2016-08-19 ENCOUNTER — Emergency Department (HOSPITAL_COMMUNITY): Payer: BLUE CROSS/BLUE SHIELD

## 2016-08-19 ENCOUNTER — Encounter (HOSPITAL_COMMUNITY): Payer: Self-pay | Admitting: Emergency Medicine

## 2016-08-19 DIAGNOSIS — R109 Unspecified abdominal pain: Secondary | ICD-10-CM | POA: Diagnosis not present

## 2016-08-19 DIAGNOSIS — R1013 Epigastric pain: Secondary | ICD-10-CM | POA: Diagnosis present

## 2016-08-19 DIAGNOSIS — Z79899 Other long term (current) drug therapy: Secondary | ICD-10-CM | POA: Insufficient documentation

## 2016-08-19 DIAGNOSIS — F909 Attention-deficit hyperactivity disorder, unspecified type: Secondary | ICD-10-CM | POA: Diagnosis not present

## 2016-08-19 DIAGNOSIS — R1010 Upper abdominal pain, unspecified: Secondary | ICD-10-CM | POA: Insufficient documentation

## 2016-08-19 DIAGNOSIS — F17200 Nicotine dependence, unspecified, uncomplicated: Secondary | ICD-10-CM | POA: Insufficient documentation

## 2016-08-19 DIAGNOSIS — K2970 Gastritis, unspecified, without bleeding: Secondary | ICD-10-CM | POA: Diagnosis not present

## 2016-08-19 DIAGNOSIS — R110 Nausea: Secondary | ICD-10-CM | POA: Diagnosis not present

## 2016-08-19 LAB — COMPREHENSIVE METABOLIC PANEL
ALBUMIN: 4.1 g/dL (ref 3.5–5.0)
ALT: 23 U/L (ref 17–63)
ANION GAP: 8 (ref 5–15)
AST: 23 U/L (ref 15–41)
Alkaline Phosphatase: 63 U/L (ref 38–126)
BILIRUBIN TOTAL: 0.5 mg/dL (ref 0.3–1.2)
BUN: 12 mg/dL (ref 6–20)
CALCIUM: 8.9 mg/dL (ref 8.9–10.3)
CHLORIDE: 106 mmol/L (ref 101–111)
CO2: 24 mmol/L (ref 22–32)
Creatinine, Ser: 0.78 mg/dL (ref 0.61–1.24)
GLUCOSE: 89 mg/dL (ref 65–99)
POTASSIUM: 3.7 mmol/L (ref 3.5–5.1)
Sodium: 138 mmol/L (ref 135–145)
Total Protein: 7.1 g/dL (ref 6.5–8.1)

## 2016-08-19 LAB — CBC WITH DIFFERENTIAL/PLATELET
Basophils Absolute: 0 10*3/uL (ref 0.0–0.1)
Basophils Relative: 0 %
EOS ABS: 0.1 10*3/uL (ref 0.0–0.7)
Eosinophils Relative: 1 %
HEMATOCRIT: 40 % (ref 39.0–52.0)
HEMOGLOBIN: 13.1 g/dL (ref 13.0–17.0)
LYMPHS ABS: 1.7 10*3/uL (ref 0.7–4.0)
LYMPHS PCT: 18 %
MCH: 27.5 pg (ref 26.0–34.0)
MCHC: 32.8 g/dL (ref 30.0–36.0)
MCV: 83.9 fL (ref 78.0–100.0)
MONOS PCT: 4 %
Monocytes Absolute: 0.3 10*3/uL (ref 0.1–1.0)
NEUTROS ABS: 7.3 10*3/uL (ref 1.7–7.7)
Neutrophils Relative %: 77 %
Platelets: 319 10*3/uL (ref 150–400)
RBC: 4.77 MIL/uL (ref 4.22–5.81)
RDW: 14.9 % (ref 11.5–15.5)
WBC: 9.4 10*3/uL (ref 4.0–10.5)

## 2016-08-19 LAB — LIPASE, BLOOD: Lipase: 23 U/L (ref 11–51)

## 2016-08-19 MED ORDER — PANTOPRAZOLE SODIUM 20 MG PO TBEC: 20.0000 mg | DELAYED_RELEASE_TABLET | Freq: Every day | ORAL | 0 refills | Status: DC

## 2016-08-19 MED ORDER — SODIUM CHLORIDE 0.9 % IV BOLUS (SEPSIS)
1000.0000 mL | Freq: Once | INTRAVENOUS | Status: AC
  Administered 2016-08-19: 1000 mL via INTRAVENOUS

## 2016-08-19 MED ORDER — PANTOPRAZOLE SODIUM 40 MG IV SOLR
40.0000 mg | Freq: Once | INTRAVENOUS | Status: AC
  Administered 2016-08-19: 40 mg via INTRAVENOUS
  Filled 2016-08-19: qty 40

## 2016-08-19 MED ORDER — ONDANSETRON HCL 4 MG PO TABS: 4.0000 mg | ORAL_TABLET | Freq: Four times a day (QID) | ORAL | 0 refills | Status: DC

## 2016-08-19 MED ORDER — ONDANSETRON HCL 4 MG/2ML IJ SOLN
4.0000 mg | Freq: Once | INTRAMUSCULAR | Status: AC
  Administered 2016-08-19: 4 mg via INTRAVENOUS
  Filled 2016-08-19: qty 2

## 2016-08-19 NOTE — ED Notes (Signed)
Pt ambulatory and independent at discharge.  Verbalized understanding of discharge instructions 

## 2016-08-19 NOTE — ED Notes (Signed)
ED Provider at bedside. 

## 2016-08-19 NOTE — Discharge Instructions (Signed)
Follow up with your md in one week or sooner if problems

## 2016-08-19 NOTE — ED Notes (Signed)
Patient transported to X-ray 
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
## 2016-09-10 DIAGNOSIS — S31609A Unspecified open wound of abdominal wall, unspecified quadrant with penetration into peritoneal cavity, initial encounter: Secondary | ICD-10-CM | POA: Diagnosis not present

## 2016-09-26 ENCOUNTER — Telehealth (HOSPITAL_COMMUNITY): Payer: Self-pay
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
## 2016-09-26 NOTE — Telephone Encounter (Signed)
[phone] mom called needing to make an appointment

## 2016-10-03 DIAGNOSIS — Z09 Encounter for follow-up examination after completed treatment for conditions other than malignant neoplasm: Secondary | ICD-10-CM | POA: Diagnosis not present

## 2016-10-11 DIAGNOSIS — S31609A Unspecified open wound of abdominal wall, unspecified quadrant with penetration into peritoneal cavity, initial encounter: Secondary | ICD-10-CM | POA: Diagnosis not present

## 2016-10-16 ENCOUNTER — Other Ambulatory Visit: Payer: Self-pay | Admitting: General Surgery

## 2016-10-16 DIAGNOSIS — S31109A Unspecified open wound of abdominal wall, unspecified quadrant without penetration into peritoneal cavity, initial encounter: Secondary | ICD-10-CM | POA: Diagnosis not present

## 2016-10-16 DIAGNOSIS — Z933 Colostomy status: Secondary | ICD-10-CM

## 2016-10-16 DIAGNOSIS — W3400XA Accidental discharge from unspecified firearms or gun, initial encounter: Secondary | ICD-10-CM | POA: Diagnosis not present

## 2016-10-17 ENCOUNTER — Encounter (HOSPITAL_COMMUNITY): Payer: Self-pay | Admitting: Psychiatry

## 2016-10-17 ENCOUNTER — Ambulatory Visit (INDEPENDENT_AMBULATORY_CARE_PROVIDER_SITE_OTHER): Payer: BLUE CROSS/BLUE SHIELD | Admitting: Psychiatry

## 2016-10-17 VITALS — BP 102/66 | HR 97

## 2016-10-17 DIAGNOSIS — F4010 Social phobia, unspecified: Secondary | ICD-10-CM | POA: Diagnosis not present

## 2016-10-17 DIAGNOSIS — F900 Attention-deficit hyperactivity disorder, predominantly inattentive type: Secondary | ICD-10-CM

## 2016-10-17 DIAGNOSIS — F330 Major depressive disorder, recurrent, mild: Secondary | ICD-10-CM

## 2016-10-17 DIAGNOSIS — F17200 Nicotine dependence, unspecified, uncomplicated: Secondary | ICD-10-CM

## 2016-10-17 DIAGNOSIS — F909 Attention-deficit hyperactivity disorder, unspecified type: Secondary | ICD-10-CM

## 2016-10-17 DIAGNOSIS — Z79899 Other long term (current) drug therapy: Secondary | ICD-10-CM | POA: Diagnosis not present

## 2016-10-17 DIAGNOSIS — F4310 Post-traumatic stress disorder, unspecified: Secondary | ICD-10-CM

## 2016-10-17 DIAGNOSIS — Z791 Long term (current) use of non-steroidal anti-inflammatories (NSAID): Secondary | ICD-10-CM | POA: Diagnosis not present

## 2016-10-17 MED ORDER — AMPHETAMINE-DEXTROAMPHET ER 20 MG PO CP24: 20.0000 mg | ORAL_CAPSULE | Freq: Two times a day (BID) | ORAL | 0 refills | Status: DC
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
## 2016-10-21 DIAGNOSIS — S31609A Unspecified open wound of abdominal wall, unspecified quadrant with penetration into peritoneal cavity, initial encounter: Secondary | ICD-10-CM | POA: Diagnosis not present

## 2016-10-29 ENCOUNTER — Other Ambulatory Visit: Payer: Self-pay | Admitting: General Surgery

## 2016-10-29 ENCOUNTER — Ambulatory Visit: Payer: Self-pay | Admitting: General Surgery

## 2016-11-20 DIAGNOSIS — S31609A Unspecified open wound of abdominal wall, unspecified quadrant with penetration into peritoneal cavity, initial encounter: Secondary | ICD-10-CM | POA: Diagnosis not present

## 2016-12-17 NOTE — Pre-Procedure Instructions (Signed)
Nathan Vincent  12/17/2016      [id number] - [place], [place] - [address] [place] [address] Phone: [phone] Fax: [phone]  [id number] - [place], [place] [address] [place] [address] Phone: [phone] Fax: [phone]    Your procedure is scheduled on Thursday, December 19, 2016.  Report to [facility] Admitting at [place].M.  Call this number if you have problems the morning of surgery:  [phone]   Remember:  Do not eat food or drink liquids after midnight.  Continue all other medications as directed by your physician except follow these instructions about your medications   Take these medicines the morning of surgery with A SIP OF WATER: Pantoprazole (Protonix)  7 days prior to surgery STOP taking any Aspirin, Aleve, Naproxen, Ibuprofen, Motrin, Advil, Goody's, BC's, all herbal medications, fish oil, and all vitamins   Do not wear jewelry.  Do not wear lotions, powders, or colognes, or deodorant.  Men may shave face and neck.  Do not bring valuables to the hospital.  [facility] is not responsible for any belongings or valuables.  Contacts, dentures or bridgework may not be worn into surgery.  Leave your suitcase in the car.  After surgery it may be brought to your room.  For patients admitted to the hospital, discharge time will be determined by your treatment team.  Patients discharged the day of surgery will not be allowed to drive home.   Name and phone number of your driver:    Special instructions:   [facility]- Preparing For Surgery  Before surgery, you can play an important role. Because skin is not sterile, your skin needs to be as free of germs as possible. You can reduce the number of germs on your skin by washing with CHG (chlorahexidine gluconate) Soap before surgery.  CHG is an antiseptic cleaner which kills germs and bonds with the skin to continue  killing germs even after washing.  Please do not use if you have an allergy to CHG or antibacterial soaps. If your skin becomes reddened/irritated stop using the CHG.  Do not shave (including legs and underarms) for at least 48 hours prior to first CHG shower. It is OK to shave your face.  Please follow these instructions carefully.   1. Shower the NIGHT BEFORE SURGERY and the MORNING OF SURGERY with CHG.   2. If you chose to wash your hair, wash your hair first as usual with your normal shampoo.  3. After you shampoo, rinse your hair and body thoroughly to remove the shampoo.  4. Use CHG as you would any other liquid soap. You can apply CHG directly to the skin and wash gently with a scrungie or a clean washcloth.   5. Apply the CHG Soap to your body ONLY FROM THE NECK DOWN.  Do not use on open wounds or open sores. Avoid contact with your eyes, ears, mouth and genitals (private parts). Wash genitals (private parts) with your normal soap.  6. Wash thoroughly, paying special attention to the area where your surgery will be performed.  7. Thoroughly rinse your body with warm water from the neck down.  8. DO NOT shower/wash with your normal soap after using and rinsing off the CHG Soap.  9. Pat yourself dry with a CLEAN TOWEL.   10. Wear CLEAN PAJAMAS   11. Place CLEAN SHEETS on your bed the night of your  first shower and DO NOT SLEEP WITH PETS.    Day of Surgery: Shower as stated above. Do not apply any deodorants/lotions. Please wear clean clothes to the hospital/surgery center.      Please read over the following fact sheets that you were given. Pain Booklet, Coughing and Deep Breathing and Surgical Site Infection Prevention

## 2016-12-18 ENCOUNTER — Encounter (HOSPITAL_COMMUNITY): Payer: Self-pay

## 2016-12-18 ENCOUNTER — Encounter (HOSPITAL_COMMUNITY)
Admission: RE | Admit: 2016-12-18 | Discharge: 2016-12-18 | Disposition: A | Discharge status: Home / Self Care | Payer: BLUE CROSS/BLUE SHIELD | Source: Ambulatory Visit | Attending: General Surgery | Admitting: General Surgery

## 2016-12-18 DIAGNOSIS — F418 Other specified anxiety disorders: Secondary | ICD-10-CM | POA: Diagnosis not present

## 2016-12-18 DIAGNOSIS — Z933 Colostomy status: Secondary | ICD-10-CM | POA: Insufficient documentation

## 2016-12-18 DIAGNOSIS — Z87891 Personal history of nicotine dependence: Secondary | ICD-10-CM | POA: Diagnosis not present

## 2016-12-18 DIAGNOSIS — F900 Attention-deficit hyperactivity disorder, predominantly inattentive type: Secondary | ICD-10-CM | POA: Diagnosis not present

## 2016-12-18 DIAGNOSIS — F339 Major depressive disorder, recurrent, unspecified: Secondary | ICD-10-CM | POA: Diagnosis not present

## 2016-12-18 DIAGNOSIS — Z433 Encounter for attention to colostomy: Secondary | ICD-10-CM | POA: Diagnosis not present

## 2016-12-18 DIAGNOSIS — K660 Peritoneal adhesions (postprocedural) (postinfection): Secondary | ICD-10-CM | POA: Diagnosis not present

## 2016-12-18 DIAGNOSIS — F901 Attention-deficit hyperactivity disorder, predominantly hyperactive type: Secondary | ICD-10-CM | POA: Diagnosis not present

## 2016-12-18 HISTORY — DX: Gastro-esophageal reflux disease without esophagitis: K21.9

## 2016-12-18 LAB — CBC
HEMATOCRIT: 49.3 % (ref 39.0–52.0)
HEMOGLOBIN: 16.7 g/dL (ref 13.0–17.0)
MCH: 29.2 pg (ref 26.0–34.0)
MCHC: 33.9 g/dL (ref 30.0–36.0)
MCV: 86.2 fL (ref 78.0–100.0)
Platelets: 274 10*3/uL (ref 150–400)
RBC: 5.72 MIL/uL (ref 4.22–5.81)
RDW: 14.8 % (ref 11.5–15.5)
WBC: 9.5 10*3/uL (ref 4.0–10.5)
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
## 2016-12-18 NOTE — Progress Notes (Signed)
BRYON PARKER            12/17/2016                          [id number] - [place], [place] - [address] [place] [address] Phone: [phone] Fax: [phone]  [id number] - [place], [place] [address] [place] [address] Phone: [phone] Fax: [phone]              Your procedure is scheduled on Thursday, December 19, 2016.            Report to [facility] Admitting at [place].M.            Call this number if you have problems the morning of surgery:            [phone]             Remember:            Do not eat food or drink liquids after midnight.  Continue all other medications as directed by your physician except follow these instructions about your medications             Take these medicines the morning of surgery with A SIP OF WATER: Adderall XR   Tums if needed  7 days prior to surgery STOP taking any Aspirin, Aleve, Naproxen, Ibuprofen, Motrin, Advil, Goody's, BC's, all herbal medications, fish oil, and all vitamins             Do not wear jewelry.            Do not wear lotions, powders, or colognes, or deodorant.            Men may shave face and neck.            Do not bring valuables to the hospital.            [facility] is not responsible for any belongings or valuables.  Contacts, dentures or bridgework may not be worn into surgery.  Leave your suitcase in the car.  After surgery it may be brought to your room.  For patients admitted to the hospital, discharge time will be determined by your treatment team.  Patients discharged the day of surgery will not be allowed to drive home.   Name and phone number of your driver:    Special instructions:   [place]- Preparing For Surgery  Before surgery, you can play an important role. Because skin is not sterile, your skin needs to be as free of germs as possible. You can reduce the number of germs on  your skin by washing with CHG (chlorahexidine gluconate) Soap before surgery.  CHG is an antiseptic cleaner which kills germs and bonds with the skin to continue killing germs even after washing.  Please do not use if you have an allergy to CHG or antibacterial soaps. If your skin becomes reddened/irritated stop using the CHG.  Do not shave (including legs and underarms) for at least 48 hours prior to first CHG shower. It is OK to shave your face.  Please follow these instructions carefully.  1. Shower the NIGHT BEFORE SURGERY and the MORNING OF SURGERY with CHG.   2. If you chose to wash your hair, wash your hair first as usual with your normal shampoo.  3. After you shampoo, rinse your hair and body thoroughly to remove the shampoo.  4. Use CHG as you would any other liquid soap. You can apply CHG directly to the skin and wash gently with a scrungie or a clean washcloth.   5. Apply the CHG Soap to your body ONLY FROM THE NECK DOWN.  Do not use on open wounds or open sores. Avoid contact with your eyes, ears, mouth and genitals (private parts). Wash genitals (private parts) with your normal soap.  6. Wash thoroughly, paying special attention to the area where your surgery will be performed.  7. Thoroughly rinse your body with warm water from the neck down.  8. DO NOT shower/wash with your normal soap after using and rinsing off the CHG Soap.  9. Pat yourself dry with a CLEAN TOWEL.   10. Wear CLEAN PAJAMAS   11. Place CLEAN SHEETS on your bed the night of your first shower and DO NOT SLEEP WITH PETS.    Day of Surgery: Shower as stated above. Do not apply any deodorants/lotions. Please wear clean clothes to the hospital/surgery center.      Please read over the following fact sheets that you were given. Pain Booklet, Coughing and Deep Breathing  and Surgical Site Infection Prevention

## 2016-12-19 ENCOUNTER — Inpatient Hospital Stay (HOSPITAL_COMMUNITY): Payer: BLUE CROSS/BLUE SHIELD | Admitting: Anesthesiology

## 2016-12-19 ENCOUNTER — Encounter (HOSPITAL_COMMUNITY): Admission: RE | Disposition: A | Discharge status: Home / Self Care | Payer: Self-pay | Source: Ambulatory Visit

## 2016-12-19 ENCOUNTER — Encounter (HOSPITAL_COMMUNITY): Payer: Self-pay | Admitting: Critical Care Medicine

## 2016-12-19 ENCOUNTER — Inpatient Hospital Stay (HOSPITAL_COMMUNITY)
Admission: RE | Admit: 2016-12-19 | Discharge: 2016-12-25 | DRG: 330 | Disposition: A | Discharge status: Home / Self Care | Payer: BLUE CROSS/BLUE SHIELD | Source: Ambulatory Visit | Attending: General Surgery | Admitting: General Surgery

## 2016-12-19 DIAGNOSIS — Z87891 Personal history of nicotine dependence: Secondary | ICD-10-CM

## 2016-12-19 DIAGNOSIS — Z433 Encounter for attention to colostomy: Secondary | ICD-10-CM | POA: Diagnosis not present

## 2016-12-19 DIAGNOSIS — F339 Major depressive disorder, recurrent, unspecified: Secondary | ICD-10-CM | POA: Diagnosis present

## 2016-12-19 DIAGNOSIS — F901 Attention-deficit hyperactivity disorder, predominantly hyperactive type: Secondary | ICD-10-CM | POA: Diagnosis present

## 2016-12-19 DIAGNOSIS — F418 Other specified anxiety disorders: Secondary | ICD-10-CM | POA: Diagnosis not present

## 2016-12-19 DIAGNOSIS — K660 Peritoneal adhesions (postprocedural) (postinfection): Secondary | ICD-10-CM | POA: Diagnosis present

## 2016-12-19 DIAGNOSIS — F900 Attention-deficit hyperactivity disorder, predominantly inattentive type: Secondary | ICD-10-CM | POA: Diagnosis not present

## 2016-12-19 DIAGNOSIS — Z9889 Other specified postprocedural states: Secondary | ICD-10-CM

## 2016-12-19 HISTORY — PX: COLOSTOMY TAKEDOWN: SHX5783

## 2016-12-19 LAB — CBC
HCT: 45.6 % (ref 39.0–52.0)
HEMOGLOBIN: 15.7 g/dL (ref 13.0–17.0)
MCH: 29.3 pg (ref 26.0–34.0)
MCHC: 34.4 g/dL (ref 30.0–36.0)
MCV: 85.1 fL (ref 78.0–100.0)
Platelets: 274 10*3/uL (ref 150–400)
RBC: 5.36 MIL/uL (ref 4.22–5.81)
RDW: 14.8 % (ref 11.5–15.5)
WBC: 19.1 10*3/uL — AB (ref 4.0–10.5)

## 2016-12-19 LAB — CREATININE, SERUM
CREATININE: 0.97 mg/dL (ref 0.61–1.24)
GFR calc Af Amer: >60 mL/min (ref >60)
GFR calc non Af Amer: >60 mL/min (ref >60)

## 2016-12-19 SURGERY — CLOSURE, COLOSTOMY
Anesthesia: General | Site: Abdomen

## 2016-12-19 MED ORDER — PROPOFOL 10 MG/ML IV BOLUS
INTRAVENOUS | Status: DC | PRN
  Administered 2016-12-19: 30 mg via INTRAVENOUS
  Administered 2016-12-19: 150 mg via INTRAVENOUS
  Administered 2016-12-19: 20 mg via INTRAVENOUS

## 2016-12-19 MED ORDER — GABAPENTIN 300 MG PO CAPS
300.0000 mg | ORAL_CAPSULE | Freq: Two times a day (BID) | ORAL | Status: DC
  Administered 2016-12-19 – 2016-12-25 (×13): 300 mg via ORAL
  Filled 2016-12-19 (×13): qty 1

## 2016-12-19 MED ORDER — ONDANSETRON HCL 4 MG/2ML IJ SOLN
4.0000 mg | Freq: Four times a day (QID) | INTRAMUSCULAR | Status: DC | PRN
  Administered 2016-12-19 – 2016-12-20 (×2): 4 mg via INTRAVENOUS
  Filled 2016-12-19 (×2): qty 2

## 2016-12-19 MED ORDER — CEFOTETAN DISODIUM-DEXTROSE 2-2.08 GM-% IV SOLR
INTRAVENOUS | Status: AC
  Filled 2016-12-19: qty 50

## 2016-12-19 MED ORDER — SUGAMMADEX SODIUM 200 MG/2ML IV SOLN
INTRAVENOUS | Status: DC | PRN
  Administered 2016-12-19: 200 mg via INTRAVENOUS

## 2016-12-19 MED ORDER — HYDROMORPHONE HCL 1 MG/ML IJ SOLN
0.2500 mg | INTRAMUSCULAR | Status: DC | PRN
  Administered 2016-12-19 (×2): 0.5 mg via INTRAVENOUS

## 2016-12-19 MED ORDER — CHLORHEXIDINE GLUCONATE CLOTH 2 % EX PADS: 6.0000 | MEDICATED_PAD | Freq: Once | CUTANEOUS | Status: DC

## 2016-12-19 MED ORDER — MIDAZOLAM HCL 2 MG/2ML IJ SOLN
INTRAMUSCULAR | Status: AC
  Filled 2016-12-19: qty 2

## 2016-12-19 MED ORDER — FENTANYL CITRATE (PF) 250 MCG/5ML IJ SOLN
INTRAMUSCULAR | Status: AC
  Filled 2016-12-19: qty 5

## 2016-12-19 MED ORDER — LIDOCAINE 2% (20 MG/ML) 5 ML SYRINGE
INTRAMUSCULAR | Status: DC | PRN
  Administered 2016-12-19: 100 mg via INTRAVENOUS

## 2016-12-19 MED ORDER — HYDROMORPHONE 1 MG/ML IV SOLN
INTRAVENOUS | Status: AC
  Filled 2016-12-19: qty 25

## 2016-12-19 MED ORDER — DIPHENHYDRAMINE HCL 25 MG PO CAPS
25.0000 mg | ORAL_CAPSULE | Freq: Four times a day (QID) | ORAL | Status: DC | PRN
  Administered 2016-12-23: 25 mg via ORAL
  Filled 2016-12-19: qty 1

## 2016-12-19 MED ORDER — OXYCODONE HCL 5 MG/5ML PO SOLN: 5.0000 mg | Freq: Once | ORAL | Status: DC | PRN

## 2016-12-19 MED ORDER — OXYCODONE HCL 5 MG PO TABS: 5.0000 mg | ORAL_TABLET | Freq: Once | ORAL | Status: DC | PRN

## 2016-12-19 MED ORDER — FENTANYL CITRATE (PF) 250 MCG/5ML IJ SOLN
INTRAMUSCULAR | Status: DC | PRN
  Administered 2016-12-19: 100 ug via INTRAVENOUS
  Administered 2016-12-19 (×3): 50 ug via INTRAVENOUS
  Administered 2016-12-19: 100 ug via INTRAVENOUS
  Administered 2016-12-19 (×4): 50 ug via INTRAVENOUS

## 2016-12-19 MED ORDER — DIPHENHYDRAMINE HCL 12.5 MG/5ML PO ELIX: 12.5000 mg | ORAL_SOLUTION | Freq: Four times a day (QID) | ORAL | Status: DC | PRN

## 2016-12-19 MED ORDER — PROPOFOL 10 MG/ML IV BOLUS
INTRAVENOUS | Status: AC
  Filled 2016-12-19: qty 20

## 2016-12-19 MED ORDER — 0.9 % SODIUM CHLORIDE (POUR BTL) OPTIME
TOPICAL | Status: DC | PRN
  Administered 2016-12-19 (×4): 1000 mL

## 2016-12-19 MED ORDER — MIDAZOLAM HCL 5 MG/5ML IJ SOLN
INTRAMUSCULAR | Status: DC | PRN
  Administered 2016-12-19: 2 mg via INTRAVENOUS

## 2016-12-19 MED ORDER — DIPHENHYDRAMINE HCL 50 MG/ML IJ SOLN
25.0000 mg | Freq: Four times a day (QID) | INTRAMUSCULAR | Status: DC | PRN
Start: 2016-12-19 — End: 2016-12-25
  Administered 2016-12-21 – 2016-12-22 (×2): 25 mg via INTRAVENOUS
  Filled 2016-12-19 (×2): qty 1

## 2016-12-19 MED ORDER — DEXAMETHASONE SODIUM PHOSPHATE 10 MG/ML IJ SOLN
INTRAMUSCULAR | Status: DC | PRN
  Administered 2016-12-19: 10 mg via INTRAVENOUS

## 2016-12-19 MED ORDER — WHITE PETROLATUM GEL
Status: AC
  Administered 2016-12-19: 16:00:00
  Filled 2016-12-19: qty 1

## 2016-12-19 MED ORDER — METHOCARBAMOL 1000 MG/10ML IJ SOLN: 1000.0000 mg | Freq: Once | INTRAMUSCULAR | Status: DC

## 2016-12-19 MED ORDER — HYDROMORPHONE 1 MG/ML IV SOLN
INTRAVENOUS | Status: DC
  Administered 2016-12-19: 11:00:00 via INTRAVENOUS
  Administered 2016-12-19: 3.3 mg via INTRAVENOUS
  Administered 2016-12-19: 1.2 mg via INTRAVENOUS

## 2016-12-19 MED ORDER — NALOXONE HCL 0.4 MG/ML IJ SOLN: 0.4000 mg | INTRAMUSCULAR | Status: DC | PRN

## 2016-12-19 MED ORDER — HYDROMORPHONE HCL 1 MG/ML IJ SOLN
INTRAMUSCULAR | Status: AC
  Administered 2016-12-19: 0.5 mg via INTRAVENOUS
  Filled 2016-12-19: qty 1

## 2016-12-19 MED ORDER — ALVIMOPAN 12 MG PO CAPS
12.0000 mg | ORAL_CAPSULE | Freq: Two times a day (BID) | ORAL | Status: DC
  Administered 2016-12-20 – 2016-12-24 (×9): 12 mg via ORAL
  Filled 2016-12-19 (×10): qty 1

## 2016-12-19 MED ORDER — PNEUMOCOCCAL VAC POLYVALENT 25 MCG/0.5ML IJ INJ: 0.5000 mL | INJECTION | INTRAMUSCULAR | Status: DC | PRN

## 2016-12-19 MED ORDER — KCL IN DEXTROSE-NACL 20-5-0.45 MEQ/L-%-% IV SOLN
INTRAVENOUS | Status: DC
  Administered 2016-12-19 – 2016-12-20 (×2): via INTRAVENOUS
  Administered 2016-12-20: 100 mL via INTRAVENOUS
  Administered 2016-12-21: 1 mL via INTRAVENOUS
  Administered 2016-12-22: 02:00:00 via INTRAVENOUS
  Filled 2016-12-19 (×8): qty 1000

## 2016-12-19 MED ORDER — METHOCARBAMOL 1000 MG/10ML IJ SOLN
1000.0000 mg | Freq: Once | INTRAVENOUS | Status: AC
  Administered 2016-12-19: 1000 mg via INTRAVENOUS
  Filled 2016-12-19: qty 10

## 2016-12-19 MED ORDER — SODIUM CHLORIDE 0.9% FLUSH: 9.0000 mL | INTRAVENOUS | Status: DC | PRN

## 2016-12-19 MED ORDER — DIPHENHYDRAMINE HCL 50 MG/ML IJ SOLN: 12.5000 mg | Freq: Four times a day (QID) | INTRAMUSCULAR | Status: DC | PRN

## 2016-12-19 MED ORDER — AMPHETAMINE-DEXTROAMPHET ER 10 MG PO CP24
20.0000 mg | ORAL_CAPSULE | Freq: Two times a day (BID) | ORAL | Status: DC
  Administered 2016-12-19 – 2016-12-25 (×13): 20 mg via ORAL
  Filled 2016-12-19 (×13): qty 2

## 2016-12-19 MED ORDER — CELECOXIB 200 MG PO CAPS
200.0000 mg | ORAL_CAPSULE | Freq: Two times a day (BID) | ORAL | Status: DC
  Administered 2016-12-19 – 2016-12-25 (×13): 200 mg via ORAL
  Filled 2016-12-19 (×14): qty 1

## 2016-12-19 MED ORDER — ONDANSETRON HCL 4 MG/2ML IJ SOLN
INTRAMUSCULAR | Status: DC | PRN
  Administered 2016-12-19: 4 mg via INTRAVENOUS

## 2016-12-19 MED ORDER — ENOXAPARIN SODIUM 40 MG/0.4ML ~~LOC~~ SOLN
40.0000 mg | SUBCUTANEOUS | Status: DC
  Administered 2016-12-20 – 2016-12-25 (×6): 40 mg via SUBCUTANEOUS
  Filled 2016-12-19 (×6): qty 0.4

## 2016-12-19 MED ORDER — ROCURONIUM BROMIDE 10 MG/ML (PF) SYRINGE
PREFILLED_SYRINGE | INTRAVENOUS | Status: DC | PRN
  Administered 2016-12-19: 50 mg via INTRAVENOUS
  Administered 2016-12-19 (×2): 10 mg via INTRAVENOUS
  Administered 2016-12-19: 20 mg via INTRAVENOUS

## 2016-12-19 MED ORDER — LACTATED RINGERS IV SOLN
INTRAVENOUS | Status: DC | PRN
  Administered 2016-12-19 (×4): via INTRAVENOUS

## 2016-12-19 MED ORDER — CEFOTETAN DISODIUM-DEXTROSE 2-2.08 GM-% IV SOLR
2.0000 g | INTRAVENOUS | Status: AC
  Administered 2016-12-19: 2 g via INTRAVENOUS

## 2016-12-19 SURGICAL SUPPLY — 61 items
BLADE CLIPPER SURG (BLADE) IMPLANT
CANISTER SUCT 3000ML PPV (MISCELLANEOUS) ×2 IMPLANT
CHLORAPREP W/TINT 26ML (MISCELLANEOUS) ×2 IMPLANT
COVER MAYO STAND STRL (DRAPES) ×2 IMPLANT
COVER SURGICAL LIGHT HANDLE (MISCELLANEOUS) ×4 IMPLANT
DRAPE HALF SHEET 40X57 (DRAPES) ×4 IMPLANT
DRAPE LAPAROSCOPIC ABDOMINAL (DRAPES) ×2 IMPLANT
DRAPE UTILITY XL STRL (DRAPES) ×10 IMPLANT
DRAPE WARM FLUID 44X44 (DRAPE) ×2 IMPLANT
DRSG OPSITE POSTOP 3X4 (GAUZE/BANDAGES/DRESSINGS) ×1 IMPLANT
DRSG OPSITE POSTOP 4X10 (GAUZE/BANDAGES/DRESSINGS) ×1 IMPLANT
DRSG OPSITE POSTOP 4X8 (GAUZE/BANDAGES/DRESSINGS) IMPLANT
ELECT BLADE 6.5 EXT (BLADE) ×2 IMPLANT
ELECT CAUTERY BLADE 6.4 (BLADE) ×4 IMPLANT
ELECT REM PT RETURN 9FT ADLT (ELECTROSURGICAL) ×2
ELECTRODE REM PT RTRN 9FT ADLT (ELECTROSURGICAL) ×1 IMPLANT
GAUZE SPONGE 4X4 12PLY STRL LF (GAUZE/BANDAGES/DRESSINGS) ×1 IMPLANT
GEL ULTRASOUND 20GR AQUASONIC (MISCELLANEOUS) ×1 IMPLANT
GLOVE BIO SURGEON STRL SZ8 (GLOVE) ×5 IMPLANT
GLOVE BIOGEL PI IND STRL 6.5 (GLOVE) IMPLANT
GLOVE BIOGEL PI IND STRL 8 (GLOVE) ×2 IMPLANT
GLOVE BIOGEL PI INDICATOR 6.5 (GLOVE) ×3
GLOVE BIOGEL PI INDICATOR 8 (GLOVE) ×5
GLOVE ECLIPSE 7.5 STRL STRAW (GLOVE) ×2 IMPLANT
GLOVE SURG SIGNA 7.5 PF LTX (GLOVE) ×2 IMPLANT
GLOVE SURG SS PI 6.0 STRL IVOR (GLOVE) ×3 IMPLANT
GOWN STRL REUS W/ TWL LRG LVL3 (GOWN DISPOSABLE) ×4 IMPLANT
GOWN STRL REUS W/ TWL XL LVL3 (GOWN DISPOSABLE) ×2 IMPLANT
GOWN STRL REUS W/TWL LRG LVL3 (GOWN DISPOSABLE) ×6
GOWN STRL REUS W/TWL XL LVL3 (GOWN DISPOSABLE) ×10
KIT BASIN OR (CUSTOM PROCEDURE TRAY) ×2 IMPLANT
KIT ROOM TURNOVER OR (KITS) ×2 IMPLANT
LEGGING LITHOTOMY PAIR STRL (DRAPES) ×2 IMPLANT
LIGASURE IMPACT 36 18CM CVD LR (INSTRUMENTS) ×1 IMPLANT
NS IRRIG 1000ML POUR BTL (IV SOLUTION) ×6 IMPLANT
PACK GENERAL/GYN (CUSTOM PROCEDURE TRAY) ×2 IMPLANT
PAD ARMBOARD 7.5X6 YLW CONV (MISCELLANEOUS) ×2 IMPLANT
PENCIL BUTTON HOLSTER BLD 10FT (ELECTRODE) ×2 IMPLANT
SOLUTION BETADINE 4OZ (MISCELLANEOUS) ×1 IMPLANT
SPECIMEN JAR MEDIUM (MISCELLANEOUS) IMPLANT
SPONGE LAP 18X18 X RAY DECT (DISPOSABLE) ×4 IMPLANT
STAPLER CIRC ILS CVD 25MM (STAPLE) ×1 IMPLANT
STAPLER VISISTAT 35W (STAPLE) ×2 IMPLANT
SUCTION POOLE TIP (SUCTIONS) ×2 IMPLANT
SURGILUBE 2OZ TUBE FLIPTOP (MISCELLANEOUS) ×2 IMPLANT
SUT PDS AB 0 CT 36 (SUTURE) ×2 IMPLANT
SUT PDS AB 1 TP1 96 (SUTURE) ×5 IMPLANT
SUT PROLENE 2 0 CT2 30 (SUTURE) ×2 IMPLANT
SUT PROLENE 2 0 KS (SUTURE) IMPLANT
SUT SILK 2 0 SH (SUTURE) ×1 IMPLANT
SUT SILK 2 0 SH CR/8 (SUTURE) ×2 IMPLANT
SUT SILK 2 0 TIES 10X30 (SUTURE) ×3 IMPLANT
SUT SILK 3 0 SH CR/8 (SUTURE) ×2 IMPLANT
SUT SILK 3 0 TIES 10X30 (SUTURE) ×2 IMPLANT
SYR BULB IRRIGATION 50ML (SYRINGE) ×2 IMPLANT
TOWEL OR 17X26 10 PK STRL BLUE (TOWEL DISPOSABLE) ×3 IMPLANT
TRAY FOLEY W/METER SILVER 16FR (SET/KITS/TRAYS/PACK) ×2 IMPLANT
TRAY PROCTOSCOPIC FIBER OPTIC (SET/KITS/TRAYS/PACK) IMPLANT
TUBE CONNECTING 12X1/4 (SUCTIONS) ×2 IMPLANT
UNDERPAD 30X30 (UNDERPADS AND DIAPERS) IMPLANT
YANKAUER SUCT BULB TIP NO VENT (SUCTIONS) ×4 IMPLANT
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
## 2016-12-19 NOTE — Op Note (Signed)
12/19/2016  10:28 AM  PATIENT:  Nathan Vincent  21 y.o. male  PRE-OPERATIVE DIAGNOSIS:  colostomy in place   POST-OPERATIVE DIAGNOSIS:  colostomy in place   PROCEDURE:  Procedure(s): LYSIS OF ADHESIONS 60MIN RIGID PROCTOSCOPY COLOSTOMY TAKEDOWN  SURGEON:  Georganna Skeans, M.D.  ASSISTANTS: Coralie Keens, M.D. (necessary for the entirety of the procedure including extensive adhesiolysis and dissection)  ANESTHESIA:   general  EBL:  Total I/O In: 68 [I.V.:3500] Out: 380 [Urine:80; Blood:300]  BLOOD ADMINISTERED:none  DRAINS: none   SPECIMEN:  Excision  DISPOSITION OF SPECIMEN:  PATHOLOGY  COUNTS:  YES  DICTATION: .Dragon Dictation Findings: Multiple adhesions involving the omentum in the mid abdomen and the colon in the pelvis  Procedure in detail: Libero presents for colostomy takedown. He underwent a bowel prep. He was identified in the preop holding area. Informed consent was obtained. He received intravenous antibiotics. He was brought to the operating room room and general endotracheal anesthesia was administered by the anesthesia staff. We did a time out procedure. I closed this colostomy with running 2-0 silk. He was placed in lithotomy position. Foley catheter was placed. Abdomen and perineum were completely prepped and draped in a sterile fashion. We did a time out procedure. His abdomen was prepped and draped in a sterile fashion. Incision was made along the midline and his old scar was removed in its entirety. The abdomen was entered under direct vision below along the midline. There are multiple adhesions. The fascia was gradually gently opened along the midline extending more cephalad as we lysed adhesions carefully. Once that was done, we took down more omental adhesions to the bowel. We gradually freed up the colon in the pelvis, the colon going to the colostomy, and the small bowel. Total adhesiolysis 60 minutes. During this dissection, I went below  and did a rigid proctoscopy to help delineate the rectal anatomy. I then re-scrubbed in the case to facilitate completion of the adhesiolysis. An elliptical incision was made around the stoma site. This was dissected free circumferentially and the colostomy was freed from the fascia and brought into the abdomen. We followed the colon protocol. We removed the distal end and placed a size 25 sizer. That fit easily. We then tried a 29 sizer but this would not enter the colon. We selected a 25 EEA. I placed a 2-0 Prolene pursestring around the end of the proximal: In the ampulla was inserted. The Prolene was tied securely. We then cleaned the fat from around the end of the colon. We placed an additional Prolene suture to snug up the pursestring. Next, I went below and performed the EEA anastomosis. There was plenty of: Length. 2 complete donuts were obtained. Rigid proctoscopy was done after this and the colon was insufflated with air. There were no bubbles or leaks. The assistant irrigated the abdomen and we both changed our gown and gloves in accordance with the colon protocol. The anastomosis was reinspected and was completely viable and under no tension. We ensured excellent hemostasis. The stoma site fascia was closed with running 0 PDS from the inside and the outside. Midline fascia was closed with running #1 looped PDS tied in the middle. Subcutaneous tissues of both wounds were irrigated and cleansed skin was closed with staples. All counts were correct. He tolerated procedure without apparent competitions taken recovery in stable condition PATIENT DISPOSITION:  PACU - hemodynamically stable.   Delay start of Pharmacological VTE agent (>24hrs) due to surgical blood loss or risk of  bleeding:  no  Georganna Skeans, MD, MPH, FACS Pager: [phone]  8/16/201810:28 AM
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
## 2016-12-19 NOTE — Interval H&P Note (Signed)
History and Physical Interval Note:  12/19/2016 6:50 AM  Nathan Vincent  has presented today for surgery, with the diagnosis of colostomy in place   The various methods of treatment have been discussed with the patient and family. After consideration of risks, benefits and other options for treatment, the patient has consented to  Procedure(s): COLOSTOMY TAKEDOWN (N/A) as a surgical intervention .  The patient's history has been reviewed, patient examined, no change in status, stable for surgery.  I have reviewed the patient's chart and labs.  Questions were answered to the patient's satisfaction.     Nathan Vincent E

## 2016-12-19 NOTE — Transfer of Care (Signed)
Immediate Anesthesia Transfer of Care Note  Patient: Nathan Vincent  Procedure(s) Performed: Procedure(s): COLOSTOMY TAKEDOWN (N/A)  Patient Location: PACU  Anesthesia Type:General  Level of Consciousness: awake, alert  and oriented  Airway & Oxygen Therapy: Patient Spontanous Breathing and Patient connected to nasal cannula oxygen  Post-op Assessment: Report given to RN, Post -op Vital signs reviewed and stable and Patient moving all extremities X 4  Post vital signs: Reviewed and stable  Last Vitals:  Vitals:   12/19/16 0626  BP: 123/85  Pulse: 80  Resp: 18  Temp: 36.7 C  SpO2: 98%    Last Pain: There were no vitals filed for this visit.    Patients Stated Pain Goal: 3 (50/51/83 3582)  Complications: No apparent anesthesia complications

## 2016-12-19 NOTE — Anesthesia Procedure Notes (Signed)
Procedure Name: Intubation Date/Time: 12/19/2016 7:34 AM Performed by: Merrilyn Puma B Pre-anesthesia Checklist: Patient identified, Emergency Drugs available, Suction available, Patient being monitored and Timeout performed Patient Re-evaluated:Patient Re-evaluated prior to induction Oxygen Delivery Method: Circle system utilized Preoxygenation: Pre-oxygenation with 100% oxygen Induction Type: IV induction Ventilation: Mask ventilation without difficulty Laryngoscope Size: Mac and 4 Grade View: Grade I Tube type: Oral Tube size: 7.5 mm Number of attempts: 1 Airway Equipment and Method: Stylet Placement Confirmation: ETT inserted through vocal cords under direct vision,  positive ETCO2,  CO2 detector and breath sounds checked- equal and bilateral Secured at: 23 cm Tube secured with: Tape Dental Injury: Teeth and Oropharynx as per pre-operative assessment

## 2016-12-19 NOTE — Progress Notes (Signed)
Orthopedic Tech Progress Note Patient Details:  Nathan Vincent 23-Nov-1995 [id number]  Ortho Devices Type of Ortho Device: Abdominal binder Ortho Device/Splint Location: at bedside Ortho Device/Splint Interventions: [name] 12/19/2016, 1:39 PM

## 2016-12-19 NOTE — Anesthesia Postprocedure Evaluation (Signed)
Anesthesia Post Note  Patient: Nathan Vincent  Procedure(s) Performed: Procedure(s) (LRB): COLOSTOMY TAKEDOWN (N/A)     Patient location during evaluation: PACU Anesthesia Type: General Level of consciousness: awake, sedated and oriented Pain management: pain level controlled Vital Signs Assessment: post-procedure vital signs reviewed and stable Respiratory status: spontaneous breathing, nonlabored ventilation, respiratory function stable and patient connected to nasal cannula oxygen Cardiovascular status: blood pressure returned to baseline and stable Postop Assessment: no signs of nausea or vomiting Anesthetic complications: no    Last Vitals:  Vitals:   12/19/16 1115 12/19/16 1130  BP:    Pulse: 79 98  Resp: 12 (!) 22  Temp:    SpO2: 99% 99%    Last Pain:  Vitals:   12/19/16 1042  PainSc: Asleep                 Corrie Reder,JAMES TERRILL

## 2016-12-19 NOTE — Progress Notes (Signed)
Patient very sensitive to touch, abdominal binder refused at this time due to pain. Will monitor .

## 2016-12-20 ENCOUNTER — Encounter (HOSPITAL_COMMUNITY): Payer: Self-pay | Admitting: General Surgery

## 2016-12-20 LAB — CBC
HEMATOCRIT: 47.8 % (ref 39.0–52.0)
HEMOGLOBIN: 15.3 g/dL (ref 13.0–17.0)
MCH: 28 pg (ref 26.0–34.0)
MCHC: 32 g/dL (ref 30.0–36.0)
MCV: 87.4 fL (ref 78.0–100.0)
PLATELETS: 285 10*3/uL (ref 150–400)
RBC: 5.47 MIL/uL (ref 4.22–5.81)
RDW: 14.7 % (ref 11.5–15.5)
WBC: 14.5 10*3/uL — AB (ref 4.0–10.5)

## 2016-12-20 LAB — BASIC METABOLIC PANEL
ANION GAP: 9 (ref 5–15)
CALCIUM: 9.3 mg/dL (ref 8.9–10.3)
CO2: 30 mmol/L (ref 22–32)
CREATININE: 0.83 mg/dL (ref 0.61–1.24)
Chloride: 98 mmol/L — ABNORMAL LOW (ref 101–111)
GFR calc Af Amer: >60 mL/min (ref >60)
GLUCOSE: 129 mg/dL — AB (ref 65–99)
Potassium: 3.8 mmol/L (ref 3.5–5.1)
Sodium: 137 mmol/L (ref 135–145)

## 2016-12-20 MED ORDER — HYDROMORPHONE HCL 1 MG/ML IJ SOLN
1.0000 mg | INTRAMUSCULAR | Status: DC | PRN
  Administered 2016-12-20 – 2016-12-24 (×19): 1 mg via INTRAVENOUS
  Filled 2016-12-20 (×19): qty 1

## 2016-12-20 MED ORDER — METHOCARBAMOL 1000 MG/10ML IJ SOLN
500.0000 mg | Freq: Three times a day (TID) | INTRAVENOUS | Status: DC | PRN
  Filled 2016-12-20: qty 5

## 2016-12-20 NOTE — Progress Notes (Signed)
Patient ID: Nathan Vincent, male   DOB: 1995-09-24, 21 y.o.   MRN: [id number]  [facility] Progress Note  1 Day Post-Op  Subjective: CC- s/p colostomy takedown Patient states that he did not sleep well due to pain and PCA beeping. States that his pain is already less today. He has had some nausea, no emesis. No flatus or BM yet.  Objective: Vital signs in last 24 hours: Temp:  [97 F (36.1 C)-98.7 F (37.1 C)] 97 F (36.1 C) (08/17 0650) Pulse Rate:  [73-103] 88 (08/17 0645) Resp:  [10-22] 16 (08/17 0645) BP: (123-155)/(78-110) 133/78 (08/17 0645) SpO2:  [98 %-100 %] 100 % (08/17 0645)    Intake/Output from previous day: 08/16 0701 - 08/17 0700 In: 5281.7 [I.V.:5281.7] Out: 4335 [Urine:4035; Blood:300] Intake/Output this shift: No intake/output data recorded.  PE: Gen:  Alert, NAD, pleasant HEENT: EOM's intact, pupils equal and round Card:  RRR, no M/G/R heard Pulm:  CTAB, no W/R/R, effort normal Abd: Soft, ND, mild tenderness around incisions, hypoactive BS, incisions covered with honeycomb dressings saturated in old and new bloody Ext:  No erythema, edema, or tenderness BUE/BLE  Psych: A&Ox3  Skin: no rashes noted, warm and dry  Lab Results:   Recent Labs  12/19/16 1210 12/20/16 0433  WBC 19.1* 14.5*  HGB 15.7 15.3  HCT 45.6 47.8  PLT 274 285   BMET  Recent Labs  12/19/16 1210 12/20/16 0433  NA  --  137  K  --  3.8  CL  --  98*  CO2  --  30  GLUCOSE  --  129*  BUN  --  <5*  CREATININE 0.97 0.83  CALCIUM  --  9.3   PT/INR No results for input(s): LABPROT, INR in the last 72 hours. CMP     Component Value Date/Time   NA 137 12/20/2016 0433   K 3.8 12/20/2016 0433   CL 98 (L) 12/20/2016 0433   CO2 30 12/20/2016 0433   GLUCOSE 129 (H) 12/20/2016 0433   BUN <5 (L) 12/20/2016 0433   CREATININE 0.83 12/20/2016 0433   CALCIUM 9.3 12/20/2016 0433   PROT 7.1 08/19/2016 1129   ALBUMIN 4.1 08/19/2016 1129   AST 23 08/19/2016 1129    ALT 23 08/19/2016 1129   ALKPHOS 63 08/19/2016 1129   BILITOT 0.5 08/19/2016 1129   GFRNONAA >60 12/20/2016 0433   GFRAA >60 12/20/2016 0433   Lipase     Component Value Date/Time   LIPASE 23 08/19/2016 1129       Studies/Results: No results found.  Anti-infectives: Anti-infectives    Start     Dose/Rate Route Frequency Ordered Stop   12/19/16 0617  cefoTEtan in Dextrose 5% (CEFOTAN) 2-2.08 GM-% IVPB    Comments:  Forte, Lindsi   : cabinet override      12/19/16 0617 12/19/16 0739   12/19/16 0614  cefoTEtan in Dextrose 5% (CEFOTAN) IVPB 2 g     2 g Intravenous On call to O.R. 12/19/16 0321 12/19/16 0739       Assessment/Plan S/p LYSIS OF ADHESIONS 60MIN, RIGID PROCTOSCOPY, COLOSTOMY TAKEDOWN 8/16 Dr. Grandville Silos - POD 1 - on entereg - afebrile - no flatus or BM  ID - cefotan perioperative FEN - IVF, NPO VTE - SCDs, lovenox Foley - d/c 8/16  Plan - Continue NPO until bowel function returns. D/c PCA. Encourage ambulation today. I reinforced dressing with abd pad, apply ice for hemostasis.   LOS: 1 day  Nathan Vincent , [facility] 12/20/2016, 7:08 AM Pager: [phone] Consults: [phone] Mon-Fri 7:00 am-4:30 pm Sat-Sun 7:00 am-11:30 am

## 2016-12-21 LAB — BASIC METABOLIC PANEL
Anion gap: 9 (ref 5–15)
CHLORIDE: 102 mmol/L (ref 101–111)
CO2: 25 mmol/L (ref 22–32)
CREATININE: 0.61 mg/dL (ref 0.61–1.24)
Calcium: 8.9 mg/dL (ref 8.9–10.3)
GFR calc Af Amer: >60 mL/min (ref >60)
GFR calc non Af Amer: >60 mL/min (ref >60)
GLUCOSE: 100 mg/dL — AB (ref 65–99)
POTASSIUM: 3.9 mmol/L (ref 3.5–5.1)
SODIUM: 136 mmol/L (ref 135–145)

## 2016-12-21 LAB — CBC
HCT: 40.4 % (ref 39.0–52.0)
HEMOGLOBIN: 13.7 g/dL (ref 13.0–17.0)
MCH: 29 pg (ref 26.0–34.0)
MCHC: 33.9 g/dL (ref 30.0–36.0)
MCV: 85.4 fL (ref 78.0–100.0)
Platelets: 234 10*3/uL (ref 150–400)
RBC: 4.73 MIL/uL (ref 4.22–5.81)
RDW: 14.4 % (ref 11.5–15.5)
WBC: 11.3 10*3/uL — ABNORMAL HIGH (ref 4.0–10.5)

## 2016-12-21 NOTE — Progress Notes (Signed)
2 Days Post-Op   Subjective/Chief Complaint: Pt doing well this AM No bowel function mobilizing   Objective: Vital signs in last 24 hours: Temp:  [97.4 F (36.3 C)-98.3 F (36.8 C)] 98.1 F (36.7 C) (08/18 0457) Pulse Rate:  [87-96] 88 (08/18 0457) Resp:  [16] 16 (08/18 0457) BP: (109-146)/(70-97) 109/70 (08/18 0457) SpO2:  [97 %-100 %] 98 % (08/18 0457) Last BM Date: 12/18/16  Intake/Output from previous day: 08/17 0701 - 08/18 0700 In: 1800 [I.V.:1800] Out: 250 [Urine:250] Intake/Output this shift: No intake/output data recorded.  General appearance: alert and cooperative GI: soft, nttp, m/l wound c/d/i. hypoactive BS  Lab Results:   Recent Labs  12/20/16 0433 12/21/16 0445  WBC 14.5* 11.3*  HGB 15.3 13.7  HCT 47.8 40.4  PLT 285 234   BMET  Recent Labs  12/20/16 0433 12/21/16 0445  NA 137 136  K 3.8 3.9  CL 98* 102  CO2 30 25  GLUCOSE 129* 100*  BUN <5* <5*  CREATININE 0.83 0.61  CALCIUM 9.3 8.9   Anti-infectives: Anti-infectives    Start     Dose/Rate Route Frequency Ordered Stop   12/19/16 0617  cefoTEtan in Dextrose 5% (CEFOTAN) 2-2.08 GM-% IVPB    Comments:  Forte, Lindsi   : cabinet override      12/19/16 0617 12/19/16 0739   12/19/16 0614  cefoTEtan in Dextrose 5% (CEFOTAN) IVPB 2 g     2 g Intravenous On call to O.R. 12/19/16 7253 12/19/16 0739      Assessment/Plan: s/p Procedure(s): COLOSTOMY TAKEDOWN (N/A) S/p LYSIS OF ADHESIONS 60MIN, RIGID PROCTOSCOPY, COLOSTOMY TAKEDOWN 8/16 Dr. Grandville Silos POD #2 -Mobilize -will start on CLD -awaiting bowel function VTE - SCDs, lovenox    LOS: 2 days    Rosario Jacks., [facility] 12/21/2016

## 2016-12-22 NOTE — Progress Notes (Signed)
3 Days Post-Op   Subjective/Chief Complaint: NO bowel funct Ambulating tol clears   Objective: Vital signs in last 24 hours: Temp:  [97.7 F (36.5 C)-98.3 F (36.8 C)] 97.7 F (36.5 C) (08/19 0536) Pulse Rate:  [81-95] 84 (08/19 0536) Resp:  [16-17] 16 (08/19 0536) BP: (116-122)/(77-86) 116/77 (08/19 0536) SpO2:  [96 %-100 %] 100 % (08/19 0536) Last BM Date: 12/18/16  Intake/Output from previous day: 08/18 0701 - 08/19 0700 In: 3360 [P.O.:360; I.V.:3000] Out: 425 [Urine:425] Intake/Output this shift: No intake/output data recorded.  General appearance: alert and cooperative GI: soft, nttp, hhypoacttive BS, incision c/d/i  Lab Results:   Recent Labs  12/20/16 0433 12/21/16 0445  WBC 14.5* 11.3*  HGB 15.3 13.7  HCT 47.8 40.4  PLT 285 234   BMET  Recent Labs  12/20/16 0433 12/21/16 0445  NA 137 136  K 3.8 3.9  CL 98* 102  CO2 30 25  GLUCOSE 129* 100*  BUN <5* <5*  CREATININE 0.83 0.61  CALCIUM 9.3 8.9   Anti-infectives: Anti-infectives    Start     Dose/Rate Route Frequency Ordered Stop   12/19/16 0617  cefoTEtan in Dextrose 5% (CEFOTAN) 2-2.08 GM-% IVPB    Comments:  Forte, Lindsi   : cabinet override      12/19/16 0617 12/19/16 0739   12/19/16 0614  cefoTEtan in Dextrose 5% (CEFOTAN) IVPB 2 g     2 g Intravenous On call to O.R. 12/19/16 0614 12/19/16 0739      Assessment/Plan: s/p Procedure(s): COLOSTOMY TAKEDOWN (N/A)  Con't CLD for now Mobilize  Await bowel function  LOS: 3 days    Rosario Jacks., [facility] 12/22/2016

## 2016-12-23 NOTE — Progress Notes (Signed)
Patient ID: Nathan Vincent, male   DOB: June 21, 1995, 21 y.o.   MRN: [id number]  [facility] Progress Note  4 Days Post-Op  Subjective: CC- s/p colostomy takedown In good spirits. No BM or flatus yet but he does feel some rumbling in his abdomen, and was able to tolerate an entire bowl of broth yesterday. Pain improving. No recent n/v.  Objective: Vital signs in last 24 hours: Temp:  [97.5 F (36.4 C)-97.7 F (36.5 C)] 97.6 F (36.4 C) (08/20 0300) Pulse Rate:  [95-101] 100 (08/20 0300) Resp:  [15-16] 16 (08/20 0300) BP: (102-136)/(82-94) 102/82 (08/20 0300) SpO2:  [98 %-100 %] 98 % (08/20 0300) Last BM Date: 12/19/16  Intake/Output from previous day: 08/19 0701 - 08/20 0700 In: 855 [P.O.:440; I.V.:415] Out: 1000 [Urine:1000] Intake/Output this shift: No intake/output data recorded.  PE: Gen:  Alert, NAD, pleasant HEENT: EOM's intact, pupils equal and round Card:  RRR, no M/G/R heard Pulm:  CTAB, no W/R/R, effort normal Abd: Soft, ND, mild tenderness around incisions, +BS, incisions cdi with staples intact and no erythema or drainage Ext:  No erythema, edema, or tenderness BUE/BLE  Psych: A&Ox3  Skin: no rashes noted, warm and dry  Lab Results:   Recent Labs  12/21/16 0445  WBC 11.3*  HGB 13.7  HCT 40.4  PLT 234   BMET  Recent Labs  12/21/16 0445  NA 136  K 3.9  CL 102  CO2 25  GLUCOSE 100*  BUN <5*  CREATININE 0.61  CALCIUM 8.9   PT/INR No results for input(s): LABPROT, INR in the last 72 hours. CMP     Component Value Date/Time   NA 136 12/21/2016 0445   K 3.9 12/21/2016 0445   CL 102 12/21/2016 0445   CO2 25 12/21/2016 0445   GLUCOSE 100 (H) 12/21/2016 0445   BUN <5 (L) 12/21/2016 0445   CREATININE 0.61 12/21/2016 0445   CALCIUM 8.9 12/21/2016 0445   PROT 7.1 08/19/2016 1129   ALBUMIN 4.1 08/19/2016 1129   AST 23 08/19/2016 1129   ALT 23 08/19/2016 1129   ALKPHOS 63 08/19/2016 1129   BILITOT 0.5 08/19/2016 1129   GFRNONAA >60 12/21/2016 0445   GFRAA >60 12/21/2016 0445   Lipase     Component Value Date/Time   LIPASE 23 08/19/2016 1129       Studies/Results: No results found.  Anti-infectives: Anti-infectives    Start     Dose/Rate Route Frequency Ordered Stop   12/19/16 0617  cefoTEtan in Dextrose 5% (CEFOTAN) 2-2.08 GM-% IVPB    Comments:  Forte, Lindsi   : cabinet override      12/19/16 0617 12/19/16 0739   12/19/16 0614  cefoTEtan in Dextrose 5% (CEFOTAN) IVPB 2 g     2 g Intravenous On call to O.R. 12/19/16 8341 12/19/16 0739       Assessment/Plan S/p LYSIS OF ADHESIONS 60MIN, RIGID PROCTOSCOPY, COLOSTOMY TAKEDOWN 8/16 Dr. Grandville Silos - POD 4 - on entereg - afebrile - no flatus or BM  ID - cefotan perioperative FEN - IVF, clear liquids VTE - SCDs, lovenox Foley - d/c 8/16  Plan - Clear liquids until bowel function returns. Continue ambulating.    LOS: 4 days    Wellington Hampshire , [facility] Surgery 12/23/2016, 7:33 AM Pager: [phone] Consults: [phone] Mon-Fri 7:00 am-4:30 pm Sat-Sun 7:00 am-11:30 am

## 2016-12-24 MED ORDER — OXYCODONE HCL 5 MG PO TABS
5.0000 mg | ORAL_TABLET | ORAL | Status: DC | PRN
  Administered 2016-12-24 – 2016-12-25 (×5): 10 mg via ORAL
  Filled 2016-12-24: qty 1
  Filled 2016-12-24 (×2): qty 2
  Filled 2016-12-24: qty 1
  Filled 2016-12-24 (×2): qty 2

## 2016-12-24 MED ORDER — METHOCARBAMOL 500 MG PO TABS: 500.0000 mg | ORAL_TABLET | Freq: Three times a day (TID) | ORAL | Status: DC | PRN

## 2016-12-24 MED ORDER — ACETAMINOPHEN 325 MG PO TABS: 650.0000 mg | ORAL_TABLET | Freq: Four times a day (QID) | ORAL | Status: DC | PRN

## 2016-12-24 MED ORDER — ALUM & MAG HYDROXIDE-SIMETH 200-200-20 MG/5ML PO SUSP
30.0000 mL | Freq: Four times a day (QID) | ORAL | Status: DC | PRN
  Administered 2016-12-24 (×2): 30 mL via ORAL
  Filled 2016-12-24 (×2): qty 30

## 2016-12-24 MED ORDER — ONDANSETRON HCL 4 MG/2ML IJ SOLN
4.0000 mg | Freq: Three times a day (TID) | INTRAMUSCULAR | Status: DC | PRN
  Administered 2016-12-24: 4 mg via INTRAVENOUS
  Filled 2016-12-24: qty 2

## 2016-12-24 MED ORDER — ENSURE SURGERY PO LIQD
237.0000 mL | Freq: Two times a day (BID) | ORAL | Status: DC
  Administered 2016-12-24 – 2016-12-25 (×2): 237 mL via ORAL
  Filled 2016-12-24 (×5): qty 237

## 2016-12-24 MED ORDER — HYDROMORPHONE HCL 1 MG/ML IJ SOLN: 1.0000 mg | Freq: Four times a day (QID) | INTRAMUSCULAR | Status: DC | PRN

## 2016-12-24 NOTE — Progress Notes (Signed)
Patient ID: Nathan Vincent, male   DOB: 07/20/1995, 21 y.o.   MRN: [id number]  [facility] Surgery Progress Note  5 Days Post-Op  Subjective: CC- abdominal pain Patient states that he had 2 BMs over night. Passing flatus. States that he was a little nauseated when he woke up, but this has improved. No emesis. Tolerating clears. Pain well controlled and improving.  Objective: Vital signs in last 24 hours: Temp:  [97.7 F (36.5 C)-98 F (36.7 C)] 97.7 F (36.5 C) (08/21 0547) Pulse Rate:  [111-128] 112 (08/21 0547) Resp:  [17-18] 18 (08/21 0547) BP: (123-130)/(91-107) 123/91 (08/21 0547) SpO2:  [99 %-100 %] 100 % (08/21 0547) Last BM Date: 12/24/16  Intake/Output from previous day: 08/20 0701 - 08/21 0700 In: 620 [P.O.:620] Out: -  Intake/Output this shift: Total I/O In: 240 [P.O.:240] Out: -   PE: Gen: Alert, NAD, pleasant HEENT: EOM's intact, pupils equal and round Card: RRR, no M/G/R heard Pulm: CTAB, no W/R/R, effort normal Abd: Soft, ND, mild tenderness around incisions, +BS, incisions cdi with staples intact and no erythema or drainage Ext: No erythema, edema, or tenderness BUE/BLE  Psych: A&Ox3  Skin: no rashes noted, warm and dry  Lab Results:  No results for input(s): WBC, HGB, HCT, PLT in the last 72 hours. BMET No results for input(s): NA, K, CL, CO2, GLUCOSE, BUN, CREATININE, CALCIUM in the last 72 hours. PT/INR No results for input(s): LABPROT, INR in the last 72 hours. CMP     Component Value Date/Time   NA 136 12/21/2016 0445   K 3.9 12/21/2016 0445   CL 102 12/21/2016 0445   CO2 25 12/21/2016 0445   GLUCOSE 100 (H) 12/21/2016 0445   BUN <5 (L) 12/21/2016 0445   CREATININE 0.61 12/21/2016 0445   CALCIUM 8.9 12/21/2016 0445   PROT 7.1 08/19/2016 1129   ALBUMIN 4.1 08/19/2016 1129   AST 23 08/19/2016 1129   ALT 23 08/19/2016 1129   ALKPHOS 63 08/19/2016 1129   BILITOT 0.5 08/19/2016 1129   GFRNONAA >60 12/21/2016 0445   GFRAA >60  12/21/2016 0445   Lipase     Component Value Date/Time   LIPASE 23 08/19/2016 1129       Studies/Results: No results found.  Anti-infectives: Anti-infectives    Start     Dose/Rate Route Frequency Ordered Stop   12/19/16 0617  cefoTEtan in Dextrose 5% (CEFOTAN) 2-2.08 GM-% IVPB    Comments:  Forte, Lindsi   : cabinet override      12/19/16 0617 12/19/16 0739   12/19/16 0614  cefoTEtan in Dextrose 5% (CEFOTAN) IVPB 2 g     2 g Intravenous On call to O.R. 12/19/16 4944 12/19/16 0739       Assessment/Plan S/p LYSIS OF ADHESIONS 60MIN, RIGID PROCTOSCOPY, COLOSTOMY TAKEDOWN 8/16 Dr. Grandville Silos - POD 5 - afebrile - BM x2  ID - cefotan perioperative FEN - regular diet VTE - SCDs, lovenox Foley - d/c 8/16  Plan - Advance to regular diet. Decrease IVF. D/c entereg. Continue ambulating. Use IV pain medication only for breakthrough pain. Ok to shower with wound open. Likely ready for discharge tomorrow.   LOS: 5 days    Wellington Hampshire , [facility] Surgery 12/24/2016, 11:19 AM Pager: [phone] Consults: [phone] Mon-Fri 7:00 am-4:30 pm Sat-Sun 7:00 am-11:30 am
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
## 2016-12-24 NOTE — Progress Notes (Addendum)
Patient reported medium brown BM, combination of liquid and soft BM.

## 2016-12-25 LAB — CBC
HCT: 44.7 % (ref 39.0–52.0)
Hemoglobin: 15.6 g/dL (ref 13.0–17.0)
MCH: 29.5 pg (ref 26.0–34.0)
MCHC: 34.9 g/dL (ref 30.0–36.0)
MCV: 84.7 fL (ref 78.0–100.0)
PLATELETS: 359 10*3/uL (ref 150–400)
RBC: 5.28 MIL/uL (ref 4.22–5.81)
RDW: 13.9 % (ref 11.5–15.5)
WBC: 11.1 10*3/uL — AB (ref 4.0–10.5)

## 2016-12-25 MED ORDER — ONDANSETRON HCL 4 MG PO TABS: 4.0000 mg | ORAL_TABLET | Freq: Four times a day (QID) | ORAL | 0 refills | Status: DC | PRN

## 2016-12-25 MED ORDER — CELECOXIB 200 MG PO CAPS: 200.0000 mg | ORAL_CAPSULE | Freq: Two times a day (BID) | ORAL | 0 refills | Status: DC

## 2016-12-25 MED ORDER — OXYCODONE HCL 5 MG PO TABS: 5.0000 mg | ORAL_TABLET | Freq: Four times a day (QID) | ORAL | 0 refills | Status: DC | PRN

## 2016-12-25 MED ORDER — GABAPENTIN 300 MG PO CAPS: 300.0000 mg | ORAL_CAPSULE | Freq: Two times a day (BID) | ORAL | 0 refills | Status: DC

## 2016-12-25 NOTE — Progress Notes (Signed)
Discharge teaching complete. Meds. Diet, follow up appointments, activity and incisional care reviewed and all questions answered. Copy of instructions given to patient along with prescriptions. Patient discharged via wheelchair with volunteer.
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
## 2016-12-25 NOTE — Progress Notes (Signed)
Patient ID: Nathan Vincent, male   DOB: 1995/07/03, 21 y.o.   MRN: [id number]  [facility] Surgery Progress Note  6 Days Post-Op  Subjective: CC- anxious Patient states that he has had no subsequent dry heaving since yesterday morning. He had another BM this morning and is passing flatus during bowel movements. Burping is less. Does not have much of an appetite, but is tolerating Boost and full liquids. States that he is getting more anxious the longer he is here. Wants to go home. Considering leaving AMA if he does not get discharged today.  Objective: Vital signs in last 24 hours: Temp:  [97 F (36.1 C)-98 F (36.7 C)] 98 F (36.7 C) (08/22 0555) Pulse Rate:  [101-109] 104 (08/22 0555) Resp:  [17-18] 18 (08/22 0555) BP: (119-129)/(74-91) 119/74 (08/22 0555) SpO2:  [100 %] 100 % (08/22 0555) Last BM Date: 12/24/16  Intake/Output from previous day: 08/21 0701 - 08/22 0700 In: 720 [P.O.:720] Out: -  Intake/Output this shift: No intake/output data recorded.  PE: Gen: Alert, NAD, pleasant Card: RRR, no M/G/R heard Pulm: CTAB, no W/R/R, effort normal Abd: Soft, ND, nontender, +BS in all 4 quadrants, incisions cdi with staples intact and no erythema or drainage Ext: No erythema, edema, or tenderness BUE/BLE  Psych: A&Ox3  Skin: no rashes noted, warm and dry  Lab Results:   Recent Labs  12/25/16 0021  WBC 11.1*  HGB 15.6  HCT 44.7  PLT 359   BMET No results for input(s): NA, K, CL, CO2, GLUCOSE, BUN, CREATININE, CALCIUM in the last 72 hours. PT/INR No results for input(s): LABPROT, INR in the last 72 hours. CMP     Component Value Date/Time   NA 136 12/21/2016 0445   K 3.9 12/21/2016 0445   CL 102 12/21/2016 0445   CO2 25 12/21/2016 0445   GLUCOSE 100 (H) 12/21/2016 0445   BUN <5 (L) 12/21/2016 0445   CREATININE 0.61 12/21/2016 0445   CALCIUM 8.9 12/21/2016 0445   PROT 7.1 08/19/2016 1129   ALBUMIN 4.1 08/19/2016 1129   AST 23 08/19/2016 1129   ALT 23 08/19/2016 1129   ALKPHOS 63 08/19/2016 1129   BILITOT 0.5 08/19/2016 1129   GFRNONAA >60 12/21/2016 0445   GFRAA >60 12/21/2016 0445   Lipase     Component Value Date/Time   LIPASE 23 08/19/2016 1129       Studies/Results: No results found.  Anti-infectives: Anti-infectives    Start     Dose/Rate Route Frequency Ordered Stop   12/19/16 0617  cefoTEtan in Dextrose 5% (CEFOTAN) 2-2.08 GM-% IVPB    Comments:  Forte, Lindsi   : cabinet override      12/19/16 0617 12/19/16 0739   12/19/16 0614  cefoTEtan in Dextrose 5% (CEFOTAN) IVPB 2 g     2 g Intravenous On call to O.R. 12/19/16 8182 12/19/16 0739       Assessment/Plan S/p LYSIS OF ADHESIONS 60MIN, RIGID PROCTOSCOPY, COLOSTOMY TAKEDOWN 8/16 Dr. Grandville Silos - POD 6 - afebrile - BM this morning, no dry heaving since yesterday morning  ID - cefotan perioperative FEN - soft diet VTE - SCDs, lovenox Foley - d/c 8/16  Plan - Advance to soft diet for lunch. Continue ambulating. Will recheck patient this afternoon; if tolerating diet and no subsequent dry heaving will likely be ready for discharge.   LOS: 6 days    Wellington Hampshire , [facility] Surgery 12/25/2016, 8:58 AM Pager: [phone] Consults: [phone] Mon-Fri 7:00 am-4:30  pm Sat-Sun 7:00 am-11:30 am

## 2016-12-26 ENCOUNTER — Encounter (HOSPITAL_COMMUNITY): Payer: Self-pay | Admitting: Psychiatry

## 2016-12-26 ENCOUNTER — Ambulatory Visit (INDEPENDENT_AMBULATORY_CARE_PROVIDER_SITE_OTHER): Payer: BLUE CROSS/BLUE SHIELD | Admitting: Psychiatry

## 2016-12-26 VITALS — BP 118/68 | HR 135 | Ht 72.0 in | Wt 121.2 lb

## 2016-12-26 DIAGNOSIS — F4010 Social phobia, unspecified: Secondary | ICD-10-CM | POA: Diagnosis not present

## 2016-12-26 DIAGNOSIS — F1290 Cannabis use, unspecified, uncomplicated: Secondary | ICD-10-CM

## 2016-12-26 DIAGNOSIS — F4310 Post-traumatic stress disorder, unspecified: Secondary | ICD-10-CM

## 2016-12-26 DIAGNOSIS — F900 Attention-deficit hyperactivity disorder, predominantly inattentive type: Secondary | ICD-10-CM

## 2016-12-26 DIAGNOSIS — G4700 Insomnia, unspecified: Secondary | ICD-10-CM

## 2016-12-26 DIAGNOSIS — Z87891 Personal history of nicotine dependence: Secondary | ICD-10-CM

## 2016-12-26 DIAGNOSIS — R109 Unspecified abdominal pain: Secondary | ICD-10-CM

## 2016-12-26 DIAGNOSIS — F331 Major depressive disorder, recurrent, moderate: Secondary | ICD-10-CM | POA: Diagnosis not present

## 2016-12-26 MED ORDER — AMPHETAMINE-DEXTROAMPHET ER 20 MG PO CP24: 20.0000 mg | ORAL_CAPSULE | Freq: Two times a day (BID) | ORAL | 0 refills | Status: DC
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
## 2017-02-27 ENCOUNTER — Encounter (HOSPITAL_COMMUNITY): Payer: Self-pay | Admitting: Psychiatry

## 2017-02-27 ENCOUNTER — Ambulatory Visit (INDEPENDENT_AMBULATORY_CARE_PROVIDER_SITE_OTHER): Payer: BLUE CROSS/BLUE SHIELD | Admitting: Psychiatry

## 2017-02-27 VITALS — BP 105/87 | HR 117 | Ht 72.0 in | Wt 121.8 lb

## 2017-02-27 DIAGNOSIS — Z62810 Personal history of physical and sexual abuse in childhood: Secondary | ICD-10-CM

## 2017-02-27 DIAGNOSIS — F4010 Social phobia, unspecified: Secondary | ICD-10-CM

## 2017-02-27 DIAGNOSIS — F17290 Nicotine dependence, other tobacco product, uncomplicated: Secondary | ICD-10-CM

## 2017-02-27 DIAGNOSIS — Z915 Personal history of self-harm: Secondary | ICD-10-CM

## 2017-02-27 DIAGNOSIS — F331 Major depressive disorder, recurrent, moderate: Secondary | ICD-10-CM

## 2017-02-27 DIAGNOSIS — F900 Attention-deficit hyperactivity disorder, predominantly inattentive type: Secondary | ICD-10-CM

## 2017-02-27 DIAGNOSIS — F4310 Post-traumatic stress disorder, unspecified: Secondary | ICD-10-CM | POA: Diagnosis not present

## 2017-02-27 DIAGNOSIS — Z79899 Other long term (current) drug therapy: Secondary | ICD-10-CM

## 2017-02-27 MED ORDER — HYDROXYZINE HCL 10 MG PO TABS: 10.0000 mg | ORAL_TABLET | Freq: Two times a day (BID) | ORAL | 2 refills | Status: DC | PRN

## 2017-02-27 MED ORDER — AMPHETAMINE-DEXTROAMPHET ER 20 MG PO CP24: 20.0000 mg | ORAL_CAPSULE | Freq: Two times a day (BID) | ORAL | 0 refills | Status: DC
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
## 2017-03-16 ENCOUNTER — Emergency Department (HOSPITAL_COMMUNITY): Payer: BLUE CROSS/BLUE SHIELD

## 2017-03-16 ENCOUNTER — Inpatient Hospital Stay (HOSPITAL_COMMUNITY)
Admission: EM | Admit: 2017-03-16 | Discharge: 2017-03-19 | DRG: 200 | Disposition: A | Discharge status: Home / Self Care | Payer: BLUE CROSS/BLUE SHIELD | Attending: General Surgery | Admitting: General Surgery

## 2017-03-16 DIAGNOSIS — F909 Attention-deficit hyperactivity disorder, unspecified type: Secondary | ICD-10-CM | POA: Diagnosis not present

## 2017-03-16 DIAGNOSIS — S32010A Wedge compression fracture of first lumbar vertebra, initial encounter for closed fracture: Secondary | ICD-10-CM

## 2017-03-16 DIAGNOSIS — S32019A Unspecified fracture of first lumbar vertebra, initial encounter for closed fracture: Secondary | ICD-10-CM | POA: Diagnosis not present

## 2017-03-16 DIAGNOSIS — K219 Gastro-esophageal reflux disease without esophagitis: Secondary | ICD-10-CM | POA: Diagnosis not present

## 2017-03-16 DIAGNOSIS — S32048A Other fracture of fourth lumbar vertebra, initial encounter for closed fracture: Secondary | ICD-10-CM | POA: Diagnosis present

## 2017-03-16 DIAGNOSIS — M542 Cervicalgia: Secondary | ICD-10-CM | POA: Diagnosis present

## 2017-03-16 DIAGNOSIS — S270XXA Traumatic pneumothorax, initial encounter: Principal | ICD-10-CM | POA: Diagnosis present

## 2017-03-16 DIAGNOSIS — R0789 Other chest pain: Secondary | ICD-10-CM | POA: Diagnosis not present

## 2017-03-16 DIAGNOSIS — S32040A Wedge compression fracture of fourth lumbar vertebra, initial encounter for closed fracture: Secondary | ICD-10-CM | POA: Diagnosis not present

## 2017-03-16 DIAGNOSIS — S32058A Other fracture of fifth lumbar vertebra, initial encounter for closed fracture: Secondary | ICD-10-CM | POA: Diagnosis not present

## 2017-03-16 DIAGNOSIS — Y92410 Unspecified street and highway as the place of occurrence of the external cause: Secondary | ICD-10-CM

## 2017-03-16 DIAGNOSIS — S022XXA Fracture of nasal bones, initial encounter for closed fracture: Secondary | ICD-10-CM | POA: Diagnosis present

## 2017-03-16 DIAGNOSIS — S32050A Wedge compression fracture of fifth lumbar vertebra, initial encounter for closed fracture: Secondary | ICD-10-CM

## 2017-03-16 DIAGNOSIS — S299XXA Unspecified injury of thorax, initial encounter: Secondary | ICD-10-CM | POA: Diagnosis not present

## 2017-03-16 DIAGNOSIS — Z4682 Encounter for fitting and adjustment of non-vascular catheter: Secondary | ICD-10-CM | POA: Diagnosis not present

## 2017-03-16 DIAGNOSIS — S199XXA Unspecified injury of neck, initial encounter: Secondary | ICD-10-CM | POA: Diagnosis not present

## 2017-03-16 DIAGNOSIS — S32018A Other fracture of first lumbar vertebra, initial encounter for closed fracture: Secondary | ICD-10-CM | POA: Diagnosis present

## 2017-03-16 DIAGNOSIS — J939 Pneumothorax, unspecified: Secondary | ICD-10-CM | POA: Diagnosis not present

## 2017-03-16 DIAGNOSIS — S3993XA Unspecified injury of pelvis, initial encounter: Secondary | ICD-10-CM | POA: Diagnosis not present

## 2017-03-16 DIAGNOSIS — S32000A Wedge compression fracture of unspecified lumbar vertebra, initial encounter for closed fracture: Secondary | ICD-10-CM

## 2017-03-16 DIAGNOSIS — F17290 Nicotine dependence, other tobacco product, uncomplicated: Secondary | ICD-10-CM | POA: Diagnosis present

## 2017-03-16 DIAGNOSIS — S3991XA Unspecified injury of abdomen, initial encounter: Secondary | ICD-10-CM | POA: Diagnosis not present

## 2017-03-16 HISTORY — PX: CHEST TUBE INSERTION: SHX231

## 2017-03-16 LAB — CBC
HCT: 41.5 % (ref 39.0–52.0)
Hemoglobin: 14.1 g/dL (ref 13.0–17.0)
MCH: 30.1 pg (ref 26.0–34.0)
MCHC: 34 g/dL (ref 30.0–36.0)
MCV: 88.7 fL (ref 78.0–100.0)
PLATELETS: 285 10*3/uL (ref 150–400)
RBC: 4.68 MIL/uL (ref 4.22–5.81)
RDW: 15.5 % (ref 11.5–15.5)
WBC: 16.7 10*3/uL — AB (ref 4.0–10.5)

## 2017-03-16 LAB — I-STAT CG4 LACTIC ACID, ED: Lactic Acid, Venous: 2.99 mmol/L (ref 0.5–1.9)

## 2017-03-16 LAB — I-STAT CHEM 8, ED
BUN: 6 mg/dL (ref 6–20)
CALCIUM ION: 1.08 mmol/L — AB (ref 1.15–1.40)
Chloride: 108 mmol/L (ref 101–111)
Creatinine, Ser: 1.3 mg/dL — ABNORMAL HIGH (ref 0.61–1.24)
GLUCOSE: 129 mg/dL — AB (ref 65–99)
HCT: 45 % (ref 39.0–52.0)
HEMOGLOBIN: 15.3 g/dL (ref 13.0–17.0)
Potassium: 4.1 mmol/L (ref 3.5–5.1)
Sodium: 144 mmol/L (ref 135–145)
TCO2: 26 mmol/L (ref 22–32)

## 2017-03-16 LAB — ETHANOL: ALCOHOL ETHYL (B): 239 mg/dL — AB (ref <10)

## 2017-03-16 MED ORDER — IOPAMIDOL (ISOVUE-300) INJECTION 61%
INTRAVENOUS | Status: AC
Start: 2017-03-16 — End: 2017-03-16
  Administered 2017-03-16: 100 mL via INTRAVENOUS
  Filled 2017-03-16: qty 100

## 2017-03-16 MED ORDER — SODIUM CHLORIDE 0.9 % IV BOLUS (SEPSIS)
1000.0000 mL | Freq: Once | INTRAVENOUS | Status: AC
  Administered 2017-03-16: 1000 mL via INTRAVENOUS

## 2017-03-16 MED ORDER — FENTANYL CITRATE (PF) 100 MCG/2ML IJ SOLN
50.0000 ug | Freq: Once | INTRAMUSCULAR | Status: AC
  Administered 2017-03-16: 50 ug via INTRAVENOUS
  Filled 2017-03-16: qty 2

## 2017-03-16 MED ORDER — LIDOCAINE HCL (PF) 1 % IJ SOLN
10.0000 mL | Freq: Once | INTRAMUSCULAR | Status: AC
  Administered 2017-03-17: 10 mL via INTRADERMAL
  Filled 2017-03-16: qty 10

## 2017-03-16 MED ORDER — SODIUM CHLORIDE 0.9 % IV BOLUS (SEPSIS)
500.0000 mL | Freq: Once | INTRAVENOUS | Status: AC
  Administered 2017-03-16: 500 mL via INTRAVENOUS

## 2017-03-16 MED ORDER — ONDANSETRON HCL 4 MG/2ML IJ SOLN
4.0000 mg | Freq: Once | INTRAMUSCULAR | Status: AC
  Administered 2017-03-16: 4 mg via INTRAVENOUS
  Filled 2017-03-16: qty 2

## 2017-03-16 NOTE — ED Notes (Signed)
Dr Tomi Bamberger given a copy of lactic acid results 2.99

## 2017-03-16 NOTE — ED Notes (Signed)
Black knife is with security

## 2017-03-16 NOTE — ED Notes (Signed)
ED Provider at bedside. 
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
## 2017-03-17 ENCOUNTER — Emergency Department (HOSPITAL_COMMUNITY): Payer: BLUE CROSS/BLUE SHIELD

## 2017-03-17 DIAGNOSIS — S022XXA Fracture of nasal bones, initial encounter for closed fracture: Secondary | ICD-10-CM | POA: Diagnosis not present

## 2017-03-17 DIAGNOSIS — S32019A Unspecified fracture of first lumbar vertebra, initial encounter for closed fracture: Secondary | ICD-10-CM | POA: Diagnosis not present

## 2017-03-17 DIAGNOSIS — S32018A Other fracture of first lumbar vertebra, initial encounter for closed fracture: Secondary | ICD-10-CM | POA: Diagnosis present

## 2017-03-17 DIAGNOSIS — J939 Pneumothorax, unspecified: Secondary | ICD-10-CM | POA: Diagnosis not present

## 2017-03-17 DIAGNOSIS — K219 Gastro-esophageal reflux disease without esophagitis: Secondary | ICD-10-CM | POA: Diagnosis present

## 2017-03-17 DIAGNOSIS — S32048A Other fracture of fourth lumbar vertebra, initial encounter for closed fracture: Secondary | ICD-10-CM | POA: Diagnosis present

## 2017-03-17 DIAGNOSIS — S270XXA Traumatic pneumothorax, initial encounter: Secondary | ICD-10-CM | POA: Diagnosis not present

## 2017-03-17 DIAGNOSIS — Z4682 Encounter for fitting and adjustment of non-vascular catheter: Secondary | ICD-10-CM | POA: Diagnosis not present

## 2017-03-17 DIAGNOSIS — F17290 Nicotine dependence, other tobacco product, uncomplicated: Secondary | ICD-10-CM | POA: Diagnosis present

## 2017-03-17 DIAGNOSIS — S32058A Other fracture of fifth lumbar vertebra, initial encounter for closed fracture: Secondary | ICD-10-CM | POA: Diagnosis present

## 2017-03-17 DIAGNOSIS — Y92410 Unspecified street and highway as the place of occurrence of the external cause: Secondary | ICD-10-CM | POA: Diagnosis not present

## 2017-03-17 DIAGNOSIS — F909 Attention-deficit hyperactivity disorder, unspecified type: Secondary | ICD-10-CM | POA: Diagnosis present

## 2017-03-17 DIAGNOSIS — M542 Cervicalgia: Secondary | ICD-10-CM | POA: Diagnosis present

## 2017-03-17 DIAGNOSIS — S32000A Wedge compression fracture of unspecified lumbar vertebra, initial encounter for closed fracture: Secondary | ICD-10-CM

## 2017-03-17 LAB — URINALYSIS, ROUTINE W REFLEX MICROSCOPIC
Bacteria, UA: NONE SEEN
Bilirubin Urine: NEGATIVE
GLUCOSE, UA: NEGATIVE mg/dL
Ketones, ur: 5 mg/dL — AB
Leukocytes, UA: NEGATIVE
Nitrite: NEGATIVE
PROTEIN: NEGATIVE mg/dL
RBC / HPF: NONE SEEN RBC/hpf (ref 0–5)
SQUAMOUS EPITHELIAL / LPF: NONE SEEN
Specific Gravity, Urine: 1.023 (ref 1.005–1.030)
pH: 7 (ref 5.0–8.0)

## 2017-03-17 LAB — CBC
HCT: 40.4 % (ref 39.0–52.0)
HEMOGLOBIN: 13.5 g/dL (ref 13.0–17.0)
MCH: 29.8 pg (ref 26.0–34.0)
MCHC: 33.4 g/dL (ref 30.0–36.0)
MCV: 89.2 fL (ref 78.0–100.0)
Platelets: 260 10*3/uL (ref 150–400)
RBC: 4.53 MIL/uL (ref 4.22–5.81)
RDW: 15.7 % — ABNORMAL HIGH (ref 11.5–15.5)
WBC: 16 10*3/uL — ABNORMAL HIGH (ref 4.0–10.5)

## 2017-03-17 LAB — BASIC METABOLIC PANEL
Anion gap: 8 (ref 5–15)
CHLORIDE: 104 mmol/L (ref 101–111)
CO2: 25 mmol/L (ref 22–32)
Calcium: 8.4 mg/dL — ABNORMAL LOW (ref 8.9–10.3)
Creatinine, Ser: 0.83 mg/dL (ref 0.61–1.24)
GFR calc Af Amer: >60 mL/min (ref >60)
GFR calc non Af Amer: >60 mL/min (ref >60)
GLUCOSE: 96 mg/dL (ref 65–99)
POTASSIUM: 4.1 mmol/L (ref 3.5–5.1)
SODIUM: 137 mmol/L (ref 135–145)

## 2017-03-17 MED ORDER — ENOXAPARIN SODIUM 40 MG/0.4ML ~~LOC~~ SOLN
40.0000 mg | SUBCUTANEOUS | Status: DC
  Administered 2017-03-17: 40 mg via SUBCUTANEOUS
  Filled 2017-03-17 (×3): qty 0.4

## 2017-03-17 MED ORDER — HYDRALAZINE HCL 20 MG/ML IJ SOLN: 10.0000 mg | INTRAMUSCULAR | Status: DC | PRN

## 2017-03-17 MED ORDER — LORAZEPAM 2 MG/ML IJ SOLN
0.5000 mg | Freq: Every evening | INTRAMUSCULAR | Status: DC | PRN
  Administered 2017-03-18: 0.5 mg via INTRAVENOUS
  Filled 2017-03-17: qty 1

## 2017-03-17 MED ORDER — SODIUM CHLORIDE 0.9 % IV SOLN
INTRAVENOUS | Status: DC
  Administered 2017-03-17 (×2): via INTRAVENOUS

## 2017-03-17 MED ORDER — FENTANYL CITRATE (PF) 100 MCG/2ML IJ SOLN
50.0000 ug | Freq: Once | INTRAMUSCULAR | Status: AC
  Administered 2017-03-17: 50 ug via INTRAVENOUS
  Filled 2017-03-17: qty 2

## 2017-03-17 MED ORDER — DOCUSATE SODIUM 100 MG PO CAPS
100.0000 mg | ORAL_CAPSULE | Freq: Two times a day (BID) | ORAL | Status: DC
  Administered 2017-03-17 – 2017-03-19 (×4): 100 mg via ORAL
  Filled 2017-03-17 (×5): qty 1

## 2017-03-17 MED ORDER — ACETAMINOPHEN 325 MG PO TABS: 650.0000 mg | ORAL_TABLET | ORAL | Status: DC | PRN

## 2017-03-17 MED ORDER — MORPHINE SULFATE (PF) 4 MG/ML IV SOLN
2.0000 mg | INTRAVENOUS | Status: DC | PRN
  Administered 2017-03-19: 4 mg via INTRAVENOUS
  Filled 2017-03-17: qty 1

## 2017-03-17 MED ORDER — MORPHINE SULFATE (PF) 4 MG/ML IV SOLN
2.0000 mg | INTRAVENOUS | Status: DC | PRN
  Administered 2017-03-17: 4 mg via INTRAVENOUS
  Filled 2017-03-17: qty 1

## 2017-03-17 MED ORDER — OXYCODONE HCL 5 MG PO TABS
5.0000 mg | ORAL_TABLET | ORAL | Status: DC | PRN
  Administered 2017-03-17: 5 mg via ORAL
  Filled 2017-03-17: qty 1

## 2017-03-17 MED ORDER — LORAZEPAM 2 MG/ML IJ SOLN
0.5000 mg | Freq: Once | INTRAMUSCULAR | Status: AC
  Administered 2017-03-17: 0.5 mg via INTRAVENOUS
  Filled 2017-03-17: qty 1

## 2017-03-17 MED ORDER — OXYCODONE HCL 5 MG PO TABS
5.0000 mg | ORAL_TABLET | ORAL | Status: DC | PRN
  Administered 2017-03-17 – 2017-03-19 (×13): 10 mg via ORAL
  Filled 2017-03-17 (×13): qty 2

## 2017-03-17 MED ORDER — HYDROXYZINE HCL 10 MG PO TABS
10.0000 mg | ORAL_TABLET | Freq: Two times a day (BID) | ORAL | Status: DC | PRN
  Administered 2017-03-17: 10 mg via ORAL
  Filled 2017-03-17 (×3): qty 1

## 2017-03-17 MED ORDER — ONDANSETRON HCL 4 MG/2ML IJ SOLN
4.0000 mg | Freq: Four times a day (QID) | INTRAMUSCULAR | Status: DC | PRN
  Administered 2017-03-19: 4 mg via INTRAVENOUS
  Filled 2017-03-17: qty 2

## 2017-03-17 MED ORDER — ONDANSETRON 4 MG PO TBDP: 4.0000 mg | ORAL_TABLET | Freq: Four times a day (QID) | ORAL | Status: DC | PRN

## 2017-03-17 MED ORDER — METHOCARBAMOL 500 MG PO TABS
500.0000 mg | ORAL_TABLET | Freq: Three times a day (TID) | ORAL | Status: DC
  Administered 2017-03-17 – 2017-03-19 (×8): 500 mg via ORAL
  Filled 2017-03-17 (×8): qty 1

## 2017-03-17 MED ORDER — ACETAMINOPHEN 500 MG PO TABS
1000.0000 mg | ORAL_TABLET | Freq: Three times a day (TID) | ORAL | Status: DC
  Administered 2017-03-17 – 2017-03-19 (×8): 1000 mg via ORAL
  Filled 2017-03-17 (×8): qty 2
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
## 2017-03-17 NOTE — Progress Notes (Signed)
PT Cancellation Note  Patient Details Name: Nathan Vincent MRN: [id number] DOB: Feb 28, 1996   Cancelled Treatment:    Reason Eval/Treat Not Completed: Other (comment)   [employer] came in earlier for TLSO, but it was not compatible with chest tube; Per Rakita, RN, [employer] plans to retrn after lunch with brace; will come back for PT eval later in the afternoon after brace is delivered;   Roney Marion, [place] Pager [phone] Office [phone]    Colletta Maryland 03/17/2017, 12:26 PM

## 2017-03-17 NOTE — Progress Notes (Signed)
Pt. transported via stretcher from ER to 6N-27; alert and oriented x4; neck brace on; no c/o's numbness/tingling; see skin assessment- bruises/swelling; abrasions. (L) chest tube in place and connected to 20cm suction and no drainage in tubing. Mother with pt. Pt. oriented to room and call button.
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
## 2017-03-17 NOTE — Plan of Care (Signed)
  Education: Knowledge of General Education information will improve 03/17/2017 0509 - Progressing by Anson Fret, RN Note POC and orders reviewed with pt./ mother.

## 2017-03-17 NOTE — Progress Notes (Signed)
[facility] Tech Progress Note Patient Details:  Nathan Vincent 25-Jun-1995 [id number]  Patient ID: Nathan Vincent, male   DOB: [birth year], 21 y.o.   MRN: [id number]   Nathan Vincent 03/17/2017, 9:39 AMCalled [employer] for TLSO brace.
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
## 2017-03-17 NOTE — Plan of Care (Signed)
  Education: Knowledge of General Education information will improve 03/17/2017 1156 - Progressing by Mayra Neer D, RN   Activity: Risk for activity intolerance will decrease 03/17/2017 1156 - Progressing by Gardner Candle, RN   Pain Managment: General experience of comfort will improve 03/17/2017 1156 - Progressing by Gardner Candle, RN

## 2017-03-17 NOTE — Progress Notes (Signed)
Pt. wanting stronger pain meds and Dr/ Kinsinger paged.

## 2017-03-18 ENCOUNTER — Encounter (HOSPITAL_COMMUNITY): Payer: Self-pay | Admitting: General Practice

## 2017-03-18 ENCOUNTER — Other Ambulatory Visit: Payer: Self-pay

## 2017-03-18 ENCOUNTER — Inpatient Hospital Stay (HOSPITAL_COMMUNITY): Payer: BLUE CROSS/BLUE SHIELD

## 2017-03-18 MED ORDER — NICOTINE 14 MG/24HR TD PT24
14.0000 mg | MEDICATED_PATCH | Freq: Every day | TRANSDERMAL | Status: DC
  Administered 2017-03-18 – 2017-03-19 (×2): 14 mg via TRANSDERMAL
  Filled 2017-03-18 (×2): qty 1

## 2017-03-18 MED ORDER — DIPHENHYDRAMINE HCL 25 MG PO CAPS
25.0000 mg | ORAL_CAPSULE | Freq: Four times a day (QID) | ORAL | Status: DC | PRN
  Administered 2017-03-18: 25 mg via ORAL
  Filled 2017-03-18: qty 1

## 2017-03-18 MED ORDER — ALPRAZOLAM 0.5 MG PO TABS
1.0000 mg | ORAL_TABLET | Freq: Three times a day (TID) | ORAL | Status: DC | PRN
  Administered 2017-03-18 – 2017-03-19 (×3): 1 mg via ORAL
  Filled 2017-03-18 (×3): qty 2

## 2017-03-18 MED ORDER — LORAZEPAM 2 MG/ML IJ SOLN
2.0000 mg | Freq: Once | INTRAMUSCULAR | Status: AC
  Administered 2017-03-18: 2 mg via INTRAVENOUS
  Filled 2017-03-18: qty 1

## 2017-03-18 MED ORDER — LORAZEPAM 2 MG/ML IJ SOLN
0.5000 mg | Freq: Four times a day (QID) | INTRAMUSCULAR | Status: DC | PRN
  Administered 2017-03-18: 0.5 mg via INTRAVENOUS
  Filled 2017-03-18: qty 1

## 2017-03-18 MED ORDER — LORAZEPAM 2 MG/ML IJ SOLN
1.0000 mg | INTRAMUSCULAR | Status: DC | PRN
  Administered 2017-03-18 – 2017-03-19 (×4): 2 mg via INTRAVENOUS
  Filled 2017-03-18 (×4): qty 1

## 2017-03-18 MED ORDER — INFLUENZA VAC SPLIT QUAD 0.5 ML IM SUSY: 0.5000 mL | PREFILLED_SYRINGE | INTRAMUSCULAR | Status: DC

## 2017-03-18 MED ORDER — AMPHETAMINE-DEXTROAMPHET ER 10 MG PO CP24
20.0000 mg | ORAL_CAPSULE | Freq: Two times a day (BID) | ORAL | Status: DC
  Administered 2017-03-18 – 2017-03-19 (×3): 20 mg via ORAL
  Filled 2017-03-18 (×3): qty 2

## 2017-03-18 MED ORDER — ALPRAZOLAM 0.5 MG PO TABS
0.5000 mg | ORAL_TABLET | Freq: Three times a day (TID) | ORAL | Status: DC | PRN
  Administered 2017-03-18: 0.5 mg via ORAL
  Filled 2017-03-18: qty 1

## 2017-03-18 NOTE — Progress Notes (Signed)
Pt was agitated and he  pull the suction tube from the chest  tube  and he put it back again for a couple of times.He  stated that he need to get high and need to smoke.

## 2017-03-18 NOTE — Progress Notes (Signed)
Was called to patient room due to patient being very anxious wanting to pull out chest tube and IV so he could leave. I explained to him that I could not do any of that without the MD order. Patient stated he "either needed to be sedated or pull everything out because what we are giving him is not working". I told him I would call MD. After explaining situation to MD he said he would put in some Iv medicine and adjust the oral dose of anxiety medicine and I could give him additional dose now. Will continue to monitor.

## 2017-03-18 NOTE — Progress Notes (Signed)
Occupational therapist let me know the patient had un-hooked his own IV from the tubing. I asked patient why he did that he said to get his short on and off. I educated him that he should not do that for purposes of infection and that he should call us to do that. I asked him if I could hook it back he said he wanted it to remain off for a while. Patient refusing for me to hook up IV fluids at this point.

## 2017-03-18 NOTE — Progress Notes (Signed)
PT Cancellation Note  Patient Details Name: Nathan Vincent MRN: [id number] DOB: 1996-02-06   Cancelled Treatment:    Reason Eval/Treat Not Completed: Other (comment).  Pt restless, anxious, asking about more medication that could "knock me out".  Pt threatening to pull out his chest tube and leave the hospital.  He has walked quite a bit with his mother walking.  He report he has no issues walking and wants to leave.  PT to check in tomorrow.  RN made aware.  Thanks,    [name], [place], DPT [phone]   03/18/2017, 11:59 AM

## 2017-03-18 NOTE — Plan of Care (Signed)
  Progressing Education: Knowledge of General Education information will improve 03/18/2017 0612 - Progressing by Renata Caprice, RN Activity: Risk for activity intolerance will decrease 03/18/2017 0612 - Progressing by Renata Caprice, RN Pain Managment: General experience of comfort will improve 03/18/2017 0612 - Progressing by Renata Caprice, RN Adequate nutrition will be maintained 03/18/2017 0612 - Progressing by Renata Caprice, RN Safety: Ability to remain free from injury will improve 03/18/2017 0612 - Progressing by Renata Caprice, RN Skin Integrity: Risk for impaired skin integrity will decrease 03/18/2017 0612 - Progressing by Renata Caprice, RN
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
## 2017-03-19 ENCOUNTER — Inpatient Hospital Stay (HOSPITAL_COMMUNITY): Payer: BLUE CROSS/BLUE SHIELD

## 2017-03-19 MED ORDER — LORAZEPAM 1 MG PO TABS: 1.0000 mg | ORAL_TABLET | ORAL | Status: DC | PRN

## 2017-03-19 MED ORDER — METHOCARBAMOL 500 MG PO TABS: 500.0000 mg | ORAL_TABLET | Freq: Three times a day (TID) | ORAL | 0 refills | Status: DC

## 2017-03-19 MED ORDER — OXYCODONE HCL 5 MG PO TABS: 5.0000 mg | ORAL_TABLET | ORAL | 0 refills | Status: DC | PRN

## 2017-03-19 NOTE — Discharge Instructions (Signed)
How to Use a Back Brace A back brace is a form-fitting device that wraps around your trunk to support your lower back, abdomen, and hips. You may need to wear a back brace to relieve back pain or to correct a medical condition related to the back, such as abnormal curvature of the spine (scoliosis). A back brace can maintain or correct the shape of the spine and prevent a spinal problem from getting worse. A back brace can also take pressure off the layers of tissue (disks) between the bones of the spine (vertebrae). You may need a back brace to keep your back and spine in place while you heal from an injury or recover from surgery. Back braces can be either plastic (rigid brace) or soft elastic (dynamic brace). A rigid brace usually covers both the front and back of the entire upper body. A soft brace may cover only the lower back and abdomen and may fasten with self-adhesive elastic straps. Your health care provider will recommend the proper brace for your needs and medical condition. What are the risks?  A back brace may not help if you do not wear it as directed by your health care provider. Be sure to wear the brace exactly as instructed in order to prevent further back problems.  Wearing the brace may be uncomfortable at first. You may have trouble sleeping with it on. It may also be hard for you to do certain activities while wearing the brace. How to use a back brace Different types of braces will have different instructions for use. Follow instructions from your health care provider about:  How to put on the brace.  When and how often to wear the brace. In some cases, braces may need to be worn for long stretches of time. For example, a brace may need to be worn for 16-23 hours a day when used for scoliosis.  How to take off the brace.  Any safety tips you should follow when wearing the brace. This may include: ? Moving carefully while wearing the brace. The brace restricts your movement  and could lead to additional injuries. ? Using a cane or walker for support if you feel unsteady. ? Sitting in high, firm chairs. It may be difficult to stand up from low, soft chairs.  How to care for a back brace  Do not let the back brace get wet. Typically, you will remove the brace for bathing and then put it back on afterward.  If you have a rigid brace, be sure to store it in a safe place when you are not wearing it. This will help to prevent damage.  Clean or wash the back brace with mild soap and water as told by your health care provider. Contact a health care provider if:  Your brace gets damaged.  You have pain or discomfort when wearing the back brace.  Your back pain is getting worse or is not improving over time. This information is not intended to replace advice given to you by your health care provider. Make sure you discuss any questions you have with your health care provider. Document Released: 04/11/2011 Document Revised: 05/18/2015 Document Reviewed: 12/14/2014 Elsevier Interactive Patient Education  2018 [employer].   Pneumothorax A pneumothorax, commonly called a collapsed lung, is a condition in which air leaks from a lung and builds up in the space between the lung and the chest wall (pleural space). The air in a pneumothorax is trapped outside the lung and  takes up space, preventing the lung from fully expanding. This is a condition that usually occurs suddenly. The buildup of air may be small or large. A small pneumothorax may go away on its own. When a pneumothorax is larger, it will often require medical treatment and hospitalization. What are the causes? A pneumothorax can sometimes happen quickly with no apparent cause. People with underlying lung problems, particularly COPD or emphysema, are at higher risk of pneumothorax. However, pneumothorax can happen quickly even in people with no prior known lung problems. Trauma, surgery, medical procedures, or  injury to the chest wall can also cause a pneumothorax. What are the signs or symptoms? Sometimes a pneumothorax will have no symptoms. When symptoms are present, they can include:  Chest pain.  Shortness of breath.  Increased rate of breathing.  Bluish color to your lips or skin (cyanosis).  How is this diagnosed? Pneumothorax is usually diagnosed by a chest X-ray or chest CT scan. Your health care provider will also take a medical history and perform a physical exam to determine why you may have a pneumothorax. How is this treated? A small pneumothorax may go away on its own without treatment. Extra oxygen can sometimes help a small pneumothorax go away more quickly. For a larger pneumothorax or a pneumothorax that is causing symptoms, a procedure is usually needed to drain the air.In some cases, the health care provider may drain the air using a needle. In other cases, a chest tube may be inserted into the pleural space. A chest tube is a small tube placed between the ribs and into the pleural space. This removes the extra air and allows the lung to expand back to its normal size. A large pneumothorax will usually require a hospital stay. If there is ongoing air leakage into the pleural space, then the chest tube may need to remain in place for several days until the air leak has healed. In some cases, surgery may be needed. Follow these instructions at home:  Only take over-the-counter or prescription medicines as directed by your health care provider.  If a cough or pain makes it difficult for you to sleep at night, try sleeping in a semi-upright position in a recliner or by using 2 or 3 pillows.  Rest and limit activity as directed by your health care provider.  If you had a chest tube and it was removed, ask your health care provider when it is okay to remove the dressing. Until your health care provider says you can remove the dressing, do not allow it to get wet.  Do not smoke.  Smoking is a risk factor for pneumothorax.  Do not fly in an airplane or scuba dive until your health care provider says it is okay.  Follow up with your health care provider as directed. Get help right away if:  You have increasing chest pain or shortness of breath.  You have a cough that is not controlled with suppressants.  You begin coughing up blood.  You have pain that is getting worse or is not controlled with medicines.  You cough up thick, discolored mucus (sputum) that is yellow to green in color.  You have redness, increasing pain, or discharge at the site where a chest tube had been in place (if your pneumothorax was treated with a chest tube).  The site where your chest tube was located opens up.  You feel air coming out of the site where the chest tube was placed.  You  have a fever or persistent symptoms for more than 2-3 days.  You have a fever and your symptoms suddenly get worse. This information is not intended to replace advice given to you by your health care provider. Make sure you discuss any questions you have with your health care provider. Document Released: 04/22/2005 Document Revised: 09/28/2015 Document Reviewed: 09/15/2013 Elsevier Interactive Patient Education  [employer].  1. PAIN CONTROL:  1. Pain is best controlled by a usual combination of three different methods TOGETHER:  1. Ice/Heat 2. Over the counter pain medication 3. Prescription pain medication 2. Most patients will experience some swelling and bruising around wounds. Ice packs or heating pads (30-60 minutes up to 6 times a day) will help. Use ice for the first few days to help decrease swelling and bruising, then switch to heat to help relax tight/sore spots and speed recovery. Some people prefer to use ice alone, heat alone, alternating between ice & heat. Experiment to what works for you. Swelling and bruising can take several weeks to resolve.  3. It is helpful to take an  over-the-counter pain medication regularly for the first few weeks. Choose one of the following that works best for you:  1. Naproxen (Aleve, etc) Two 220mg  tabs twice a day 2. Ibuprofen (Advil, etc) Three 200mg  tabs four times a day (every meal & bedtime) 3. Acetaminophen (Tylenol, etc) 500-650mg  four times a day (every meal & bedtime) 4. A prescription for pain medication (such as oxycodone, hydrocodone, etc) should be given to you upon discharge. Take your pain medication as prescribed.  1. If you are having problems/concerns with the prescription medicine (does not control pain, nausea, vomiting, rash, itching, etc), please call us [phone] to see if we need to switch you to a different pain medicine that will work better for you and/or control your side effect better. 2. If you need a refill on your pain medication, please contact your pharmacy. They will contact our office to request authorization. Prescriptions will not be filled after 5 pm or on week-ends. 4. Avoid getting constipated. When taking pain medications, it is common to experience some constipation. Increasing fluid intake and taking a fiber supplement (such as Metamucil, Citrucel, FiberCon, MiraLax, etc) 1-2 times a day regularly will usually help prevent this problem from occurring. A mild laxative (prune juice, Milk of Magnesia, MiraLax, etc) should be taken according to package directions if there are no bowel movements after 48 hours.  5. Watch out for diarrhea. If you have many loose bowel movements, simplify your diet to bland foods & liquids for a few days. Stop any stool softeners and decrease your fiber supplement. Switching to mild anti-diarrheal medications (Kayopectate, Pepto Bismol) can help. If this worsens or does not improve, please call us. 6. FOLLOW UP in our office  1. Please call [facility] at [phone] to set up an appointment for a follow-up appointment approximately 2-3 weeks after discharge for wound  check  WHEN TO CALL us [phone]:  1. Poor pain control 2. Reactions / problems with new medications (rash/itching, nausea, etc)  3. Fever over 101.5 F (38.5 C) 4. Worsening swelling or bruising 5. Cough, shortness of breath, fever, chills, you pass out  The clinic staff is available to answer your questions during regular business hours (8:30am-5pm). Please dont hesitate to call and ask to speak to one of our nurses for clinical concerns.  If you have a medical emergency, go to the nearest emergency room or  call 911.  A surgeon from [facility] is always on call at the [facility], [place], [place], [place], [place] [address] ?  MAIN: [phone] ? TOLL FREE: [phone] ?  FAX [phone]  [email or web address]

## 2017-03-19 NOTE — Progress Notes (Signed)
Discharge home. Home discharge instruction given, no question verbalized. 
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
## 2017-03-24 DIAGNOSIS — S022XXA Fracture of nasal bones, initial encounter for closed fracture: Secondary | ICD-10-CM | POA: Diagnosis not present

## 2017-04-11 ENCOUNTER — Other Ambulatory Visit: Payer: Self-pay | Admitting: Physician Assistant

## 2017-04-11 ENCOUNTER — Ambulatory Visit
Admission: RE | Admit: 2017-04-11 | Discharge: 2017-04-11 | Disposition: A | Discharge status: Home / Self Care | Payer: BLUE CROSS/BLUE SHIELD | Source: Ambulatory Visit | Attending: Physician Assistant | Admitting: Physician Assistant

## 2017-04-11 DIAGNOSIS — J939 Pneumothorax, unspecified: Secondary | ICD-10-CM

## 2017-05-24 ENCOUNTER — Other Ambulatory Visit (HOSPITAL_COMMUNITY): Payer: Self-pay | Admitting: Psychiatry

## 2017-05-24 DIAGNOSIS — F4010 Social phobia, unspecified: Secondary | ICD-10-CM

## 2017-05-27 ENCOUNTER — Encounter (HOSPITAL_COMMUNITY): Payer: Self-pay | Admitting: Emergency Medicine

## 2017-05-27 ENCOUNTER — Emergency Department (HOSPITAL_COMMUNITY)
Admission: EM | Admit: 2017-05-27 | Discharge: 2017-05-28 | Disposition: A | Discharge status: Other Acute Inpatient Hospital | Payer: BLUE CROSS/BLUE SHIELD | Attending: Emergency Medicine | Admitting: Emergency Medicine

## 2017-05-27 DIAGNOSIS — E876 Hypokalemia: Secondary | ICD-10-CM | POA: Diagnosis not present

## 2017-05-27 DIAGNOSIS — F29 Unspecified psychosis not due to a substance or known physiological condition: Secondary | ICD-10-CM | POA: Diagnosis not present

## 2017-05-27 DIAGNOSIS — F1910 Other psychoactive substance abuse, uncomplicated: Secondary | ICD-10-CM | POA: Insufficient documentation

## 2017-05-27 DIAGNOSIS — F3010 Manic episode without psychotic symptoms, unspecified: Secondary | ICD-10-CM | POA: Insufficient documentation

## 2017-05-27 DIAGNOSIS — Z560 Unemployment, unspecified: Secondary | ICD-10-CM | POA: Diagnosis not present

## 2017-05-27 DIAGNOSIS — F17290 Nicotine dependence, other tobacco product, uncomplicated: Secondary | ICD-10-CM | POA: Insufficient documentation

## 2017-05-27 DIAGNOSIS — F1099 Alcohol use, unspecified with unspecified alcohol-induced disorder: Secondary | ICD-10-CM | POA: Diagnosis not present

## 2017-05-27 DIAGNOSIS — Z046 Encounter for general psychiatric examination, requested by authority: Secondary | ICD-10-CM | POA: Diagnosis not present

## 2017-05-27 DIAGNOSIS — R462 Strange and inexplicable behavior: Secondary | ICD-10-CM | POA: Diagnosis not present

## 2017-05-27 DIAGNOSIS — Z79899 Other long term (current) drug therapy: Secondary | ICD-10-CM | POA: Insufficient documentation

## 2017-05-27 DIAGNOSIS — F319 Bipolar disorder, unspecified: Secondary | ICD-10-CM | POA: Insufficient documentation

## 2017-05-27 DIAGNOSIS — F902 Attention-deficit hyperactivity disorder, combined type: Secondary | ICD-10-CM | POA: Diagnosis not present

## 2017-05-27 DIAGNOSIS — F309 Manic episode, unspecified: Secondary | ICD-10-CM | POA: Diagnosis not present

## 2017-05-27 LAB — COMPREHENSIVE METABOLIC PANEL
ALBUMIN: 4.6 g/dL (ref 3.5–5.0)
ALT: 20 U/L (ref 17–63)
AST: 38 U/L (ref 15–41)
Alkaline Phosphatase: 114 U/L (ref 38–126)
Anion gap: 10 (ref 5–15)
BILIRUBIN TOTAL: 0.9 mg/dL (ref 0.3–1.2)
BUN: 5 mg/dL — AB (ref 6–20)
CHLORIDE: 98 mmol/L — AB (ref 101–111)
CO2: 28 mmol/L (ref 22–32)
CREATININE: 0.7 mg/dL (ref 0.61–1.24)
Calcium: 9.4 mg/dL (ref 8.9–10.3)
GFR calc Af Amer: >60 mL/min (ref >60)
GLUCOSE: 93 mg/dL (ref 65–99)
Potassium: 2.8 mmol/L — ABNORMAL LOW (ref 3.5–5.1)
Sodium: 136 mmol/L (ref 135–145)
Total Protein: 8 g/dL (ref 6.5–8.1)

## 2017-05-27 LAB — RAPID URINE DRUG SCREEN, HOSP PERFORMED
AMPHETAMINES: NOT DETECTED
BARBITURATES: NOT DETECTED
BENZODIAZEPINES: NOT DETECTED
Cocaine: NOT DETECTED
Opiates: NOT DETECTED
Tetrahydrocannabinol: POSITIVE — AB

## 2017-05-27 LAB — CBC WITH DIFFERENTIAL/PLATELET
BASOS ABS: 0 10*3/uL (ref 0.0–0.1)
BASOS PCT: 0 %
EOS ABS: 0 10*3/uL (ref 0.0–0.7)
EOS PCT: 0 %
HCT: 43.2 % (ref 39.0–52.0)
HEMOGLOBIN: 14.7 g/dL (ref 13.0–17.0)
Lymphocytes Relative: 26 %
Lymphs Abs: 3.7 10*3/uL (ref 0.7–4.0)
MCH: 30.6 pg (ref 26.0–34.0)
MCHC: 34 g/dL (ref 30.0–36.0)
MCV: 89.8 fL (ref 78.0–100.0)
Monocytes Absolute: 1.2 10*3/uL — ABNORMAL HIGH (ref 0.1–1.0)
Monocytes Relative: 8 %
NEUTROS PCT: 66 %
Neutro Abs: 9.5 10*3/uL — ABNORMAL HIGH (ref 1.7–7.7)
PLATELETS: 341 10*3/uL (ref 150–400)
RBC: 4.81 MIL/uL (ref 4.22–5.81)
RDW: 13.6 % (ref 11.5–15.5)
WBC: 14.4 10*3/uL — AB (ref 4.0–10.5)

## 2017-05-27 LAB — ETHANOL: Alcohol, Ethyl (B): 29 mg/dL — ABNORMAL HIGH (ref <10)

## 2017-05-27 MED ORDER — HYDROXYZINE HCL 10 MG PO TABS
10.0000 mg | ORAL_TABLET | Freq: Two times a day (BID) | ORAL | Status: DC | PRN
  Administered 2017-05-27 – 2017-05-28 (×2): 10 mg via ORAL
  Filled 2017-05-27 (×2): qty 1

## 2017-05-27 MED ORDER — QUETIAPINE FUMARATE 50 MG PO TABS
50.0000 mg | ORAL_TABLET | Freq: Every evening | ORAL | Status: DC | PRN
  Administered 2017-05-27: 50 mg via ORAL
  Filled 2017-05-27: qty 1

## 2017-05-27 MED ORDER — GABAPENTIN 300 MG PO CAPS
300.0000 mg | ORAL_CAPSULE | Freq: Two times a day (BID) | ORAL | Status: DC
  Administered 2017-05-27 – 2017-05-28 (×2): 300 mg via ORAL
  Filled 2017-05-27 (×2): qty 1

## 2017-05-27 MED ORDER — GABAPENTIN 100 MG PO CAPS: 100.0000 mg | ORAL_CAPSULE | Freq: Three times a day (TID) | ORAL | Status: DC

## 2017-05-27 MED ORDER — AMPHETAMINE-DEXTROAMPHET ER 10 MG PO CP24
20.0000 mg | ORAL_CAPSULE | Freq: Two times a day (BID) | ORAL | Status: DC
  Filled 2017-05-27: qty 2

## 2017-05-27 MED ORDER — POTASSIUM CHLORIDE CRYS ER 20 MEQ PO TBCR
40.0000 meq | EXTENDED_RELEASE_TABLET | Freq: Every day | ORAL | Status: DC
  Administered 2017-05-27 – 2017-05-28 (×2): 40 meq via ORAL
  Filled 2017-05-27 (×2): qty 2

## 2017-05-27 NOTE — ED Notes (Signed)
Bed: WLPT4 Expected date:  Expected time:  Means of arrival:  Comments: 
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
## 2017-05-27 NOTE — ED Notes (Signed)
Patient reports increase anxiety at this time. Patient however is cooperative at this time with mother at bedside. Patient will be medicated per [facility] for increase anxiety. Patient denies SI,HI and AVH at this time. Respirations equal and unlabored, skin warm and dry. NAD. Encouragement and support provided and safety maintain. Q 15 min safety checks remains in place and video monitoring.

## 2017-05-27 NOTE — BH Assessment (Signed)
[place] Assessment Progress Note  CLARIFICATION: Father advised clinician that he went and filed IVC paperwork on pt. He indicated that it was approved by magistrate and pt should be getting served shortly.   Kenna Gilbert. Lovena Le, [place], [place], [facility] Counselor

## 2017-05-27 NOTE — ED Notes (Signed)
Pt mother at bedside visiting with pt.  

## 2017-05-27 NOTE — ED Notes (Signed)
Pt father at bedside visiting with pt.  
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
## 2017-05-27 NOTE — ED Notes (Signed)
Bed: GXI71 Expected date:  Expected time:  Means of arrival:  Comments: tr4

## 2017-05-28 ENCOUNTER — Encounter (HOSPITAL_COMMUNITY): Payer: Self-pay | Admitting: *Deleted

## 2017-05-28 ENCOUNTER — Encounter (HOSPITAL_COMMUNITY): Payer: Self-pay | Admitting: Psychiatry

## 2017-05-28 ENCOUNTER — Inpatient Hospital Stay (HOSPITAL_COMMUNITY)
Admission: AD | Admit: 2017-05-28 | Discharge: 2017-06-02 | DRG: 885 | Disposition: A | Discharge status: Home / Self Care | Payer: BLUE CROSS/BLUE SHIELD | Source: Intra-hospital | Attending: Psychiatry | Admitting: Psychiatry

## 2017-05-28 ENCOUNTER — Other Ambulatory Visit: Payer: Self-pay

## 2017-05-28 DIAGNOSIS — Z046 Encounter for general psychiatric examination, requested by authority: Secondary | ICD-10-CM | POA: Diagnosis not present

## 2017-05-28 DIAGNOSIS — Z818 Family history of other mental and behavioral disorders: Secondary | ICD-10-CM | POA: Diagnosis not present

## 2017-05-28 DIAGNOSIS — F312 Bipolar disorder, current episode manic severe with psychotic features: Secondary | ICD-10-CM | POA: Diagnosis not present

## 2017-05-28 DIAGNOSIS — F4310 Post-traumatic stress disorder, unspecified: Secondary | ICD-10-CM | POA: Diagnosis present

## 2017-05-28 DIAGNOSIS — Z560 Unemployment, unspecified: Secondary | ICD-10-CM | POA: Diagnosis not present

## 2017-05-28 DIAGNOSIS — F1290 Cannabis use, unspecified, uncomplicated: Secondary | ICD-10-CM | POA: Diagnosis not present

## 2017-05-28 DIAGNOSIS — F419 Anxiety disorder, unspecified: Secondary | ICD-10-CM | POA: Diagnosis not present

## 2017-05-28 DIAGNOSIS — Z9119 Patient's noncompliance with other medical treatment and regimen: Secondary | ICD-10-CM | POA: Diagnosis not present

## 2017-05-28 DIAGNOSIS — F1099 Alcohol use, unspecified with unspecified alcohol-induced disorder: Secondary | ICD-10-CM

## 2017-05-28 DIAGNOSIS — F17290 Nicotine dependence, other tobacco product, uncomplicated: Secondary | ICD-10-CM | POA: Diagnosis not present

## 2017-05-28 DIAGNOSIS — F29 Unspecified psychosis not due to a substance or known physiological condition: Secondary | ICD-10-CM | POA: Diagnosis not present

## 2017-05-28 DIAGNOSIS — Z91018 Allergy to other foods: Secondary | ICD-10-CM

## 2017-05-28 DIAGNOSIS — Z79899 Other long term (current) drug therapy: Secondary | ICD-10-CM

## 2017-05-28 DIAGNOSIS — F909 Attention-deficit hyperactivity disorder, unspecified type: Secondary | ICD-10-CM | POA: Diagnosis not present

## 2017-05-28 DIAGNOSIS — R451 Restlessness and agitation: Secondary | ICD-10-CM

## 2017-05-28 DIAGNOSIS — F314 Bipolar disorder, current episode depressed, severe, without psychotic features: Secondary | ICD-10-CM | POA: Diagnosis present

## 2017-05-28 DIAGNOSIS — E876 Hypokalemia: Secondary | ICD-10-CM | POA: Diagnosis not present

## 2017-05-28 DIAGNOSIS — F17210 Nicotine dependence, cigarettes, uncomplicated: Secondary | ICD-10-CM | POA: Diagnosis present

## 2017-05-28 DIAGNOSIS — F3010 Manic episode without psychotic symptoms, unspecified: Secondary | ICD-10-CM | POA: Diagnosis not present

## 2017-05-28 DIAGNOSIS — G4700 Insomnia, unspecified: Secondary | ICD-10-CM | POA: Diagnosis not present

## 2017-05-28 DIAGNOSIS — R450 Nervousness: Secondary | ICD-10-CM | POA: Diagnosis not present

## 2017-05-28 DIAGNOSIS — R443 Hallucinations, unspecified: Secondary | ICD-10-CM

## 2017-05-28 DIAGNOSIS — F902 Attention-deficit hyperactivity disorder, combined type: Secondary | ICD-10-CM | POA: Diagnosis not present

## 2017-05-28 DIAGNOSIS — R462 Strange and inexplicable behavior: Secondary | ICD-10-CM | POA: Diagnosis not present

## 2017-05-28 DIAGNOSIS — F319 Bipolar disorder, unspecified: Secondary | ICD-10-CM | POA: Diagnosis not present

## 2017-05-28 DIAGNOSIS — R4587 Impulsiveness: Secondary | ICD-10-CM

## 2017-05-28 DIAGNOSIS — F1910 Other psychoactive substance abuse, uncomplicated: Secondary | ICD-10-CM | POA: Diagnosis not present

## 2017-05-28 MED ORDER — NICOTINE 21 MG/24HR TD PT24
21.0000 mg | MEDICATED_PATCH | Freq: Every day | TRANSDERMAL | Status: DC
  Administered 2017-05-28: 21 mg via TRANSDERMAL
  Filled 2017-05-28: qty 1

## 2017-05-28 MED ORDER — HYDROXYZINE HCL 10 MG PO TABS
10.0000 mg | ORAL_TABLET | Freq: Two times a day (BID) | ORAL | Status: DC | PRN
  Administered 2017-05-28 – 2017-05-29 (×2): 10 mg via ORAL
  Filled 2017-05-28 (×2): qty 1

## 2017-05-28 MED ORDER — ACETAMINOPHEN 325 MG PO TABS
650.0000 mg | ORAL_TABLET | Freq: Four times a day (QID) | ORAL | Status: DC | PRN
  Administered 2017-05-29 – 2017-06-01 (×8): 650 mg via ORAL
  Filled 2017-05-28 (×8): qty 2

## 2017-05-28 MED ORDER — ALUM & MAG HYDROXIDE-SIMETH 200-200-20 MG/5ML PO SUSP
30.0000 mL | ORAL | Status: DC | PRN
  Administered 2017-05-30 – 2017-06-01 (×3): 30 mL via ORAL
  Filled 2017-05-28 (×3): qty 30

## 2017-05-28 MED ORDER — MAGNESIUM HYDROXIDE 400 MG/5ML PO SUSP: 30.0000 mL | Freq: Every day | ORAL | Status: DC | PRN

## 2017-05-28 MED ORDER — QUETIAPINE FUMARATE 50 MG PO TABS
50.0000 mg | ORAL_TABLET | Freq: Every evening | ORAL | Status: DC | PRN
  Administered 2017-05-28: 50 mg via ORAL
  Filled 2017-05-28: qty 1

## 2017-05-28 MED ORDER — GABAPENTIN 300 MG PO CAPS
300.0000 mg | ORAL_CAPSULE | Freq: Two times a day (BID) | ORAL | Status: DC
  Administered 2017-05-28 – 2017-05-31 (×6): 300 mg via ORAL
  Filled 2017-05-28 (×9): qty 1

## 2017-05-28 MED ORDER — POTASSIUM CHLORIDE CRYS ER 20 MEQ PO TBCR
40.0000 meq | EXTENDED_RELEASE_TABLET | Freq: Every day | ORAL | Status: DC
  Administered 2017-05-29 – 2017-06-02 (×5): 40 meq via ORAL
  Filled 2017-05-28 (×5): qty 2

## 2017-05-28 NOTE — Progress Notes (Signed)
Writer introduced self to pt. Pt noted to have a body odor on approach. Writer encouraged pt to shower before supper. Pt declined, stating that he took a shower this am. Writer explained to pt visitation hours and upcoming schedule. No concerns verbalized by pt at this time.
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
## 2017-05-28 NOTE — ED Notes (Signed)
Pt taking a shower 
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
## 2017-05-28 NOTE — ED Notes (Signed)
[facility] on unit to transport pt to [facility] unit per MD order. Personal property given to [employer] for transport. Pt ambulatory off unit in police custody.

## 2017-05-28 NOTE — Progress Notes (Signed)
Patient ID: Nathan Vincent, male   DOB: 20-Feb-1996, 22 y.o.   MRN: [id number]   D: Patient pleasant on approach tonight. Overly pleasant and polite. Stating " I feel chill right now". Denies any mood issues at present. Denies SI, HI or a/v hallucinations. No agitation noted at present. A: Staff will continue to monitor on q 15 minute checks, follow treatment plan, and give medications as ordered. R: Cooperative on the unit.

## 2017-05-28 NOTE — Tx Team (Signed)
Initial Treatment Plan 05/28/2017 5:37 PM [name] [id number]    PATIENT STRESSORS: Legal issue Medication change or noncompliance Substance abuse   PATIENT STRENGTHS: Ability for insight Active sense of humor Capable of independent living Communication skills General fund of knowledge Motivation for treatment/growth Supportive family/friends   PATIENT IDENTIFIED PROBLEMS: "breathing techniques to keep calm"    "coping skills so I don't get so amped up"                 DISCHARGE CRITERIA:  Ability to meet basic life and health needs Adequate post-discharge living arrangements Improved stabilization in mood, thinking, and/or behavior Medical problems require only outpatient monitoring Motivation to continue treatment in a less acute level of care Verbal commitment to aftercare and medication compliance  PRELIMINARY DISCHARGE PLAN: Outpatient therapy Return to previous living arrangement  PATIENT/FAMILY INVOLVEMENT: This treatment plan has been presented to and reviewed with the patient, Nathan Vincent.  The patient and family have been given the opportunity to ask questions and make suggestions.  Lucile Crater, RN 05/28/2017, 5:37 PM

## 2017-05-28 NOTE — BH Assessment (Signed)
[facility] Assessment Progress Note  Per Ethelene Hal, FNP, this pt requires psychiatric hospitalization.  Leonia Reader, RN, [facility] has assigned pt to [facility] Rm 506-2; [facility] will be ready to receive pt at 13:30.  Pt presents under IVC initiated by pt's father, and upheld by Buford Dresser, DO, and IVC documents have been faxed to [facility].  Pt's nurse, Caryl Pina, has been notified, and agrees to call report to [phone].  Pt is to be transported via Event organiser.   Jalene Mullet, [place] Coordinator [phone]
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
## 2017-05-28 NOTE — ED Notes (Signed)
Peer support at bedside 

## 2017-05-29 ENCOUNTER — Ambulatory Visit (HOSPITAL_COMMUNITY): Payer: Self-pay | Admitting: Psychiatry

## 2017-05-29 DIAGNOSIS — R443 Hallucinations, unspecified: Secondary | ICD-10-CM

## 2017-05-29 DIAGNOSIS — Z818 Family history of other mental and behavioral disorders: Secondary | ICD-10-CM

## 2017-05-29 MED ORDER — HYDROXYZINE HCL 25 MG PO TABS
25.0000 mg | ORAL_TABLET | Freq: Four times a day (QID) | ORAL | Status: DC | PRN
  Administered 2017-05-29 – 2017-05-31 (×5): 25 mg via ORAL
  Filled 2017-05-29 (×5): qty 1

## 2017-05-29 MED ORDER — ARIPIPRAZOLE 10 MG PO TABS
10.0000 mg | ORAL_TABLET | Freq: Every day | ORAL | Status: DC
  Administered 2017-05-29 – 2017-05-30 (×2): 10 mg via ORAL
  Filled 2017-05-29 (×6): qty 1

## 2017-05-29 MED ORDER — TRAZODONE HCL 50 MG PO TABS
50.0000 mg | ORAL_TABLET | Freq: Every evening | ORAL | Status: DC | PRN
  Administered 2017-05-29 – 2017-05-30 (×2): 50 mg via ORAL
  Filled 2017-05-29: qty 1

## 2017-05-29 MED ORDER — NICOTINE 21 MG/24HR TD PT24
21.0000 mg | MEDICATED_PATCH | Freq: Every day | TRANSDERMAL | Status: DC
  Administered 2017-05-29 – 2017-06-02 (×5): 21 mg via TRANSDERMAL
  Filled 2017-05-29 (×7): qty 1

## 2017-05-29 MED ORDER — QUETIAPINE FUMARATE 50 MG PO TABS
50.0000 mg | ORAL_TABLET | Freq: Every day | ORAL | Status: DC
  Filled 2017-05-29 (×2): qty 1

## 2017-05-29 NOTE — Progress Notes (Signed)
Pt c/o anxiety today and requested to have vistaril increased. Vistaril increased per Herbert Pun, NP. Pt requested to be started on Adderall to help him focus. Dr. Nancy Fetter made aware during tx team. Pt denies AVH. Pt denies SI/HI. Pt observed attending scheduled groups and participating throughout the day.  Medications administered as ordered per MD. Verbal support provided. Pt encouraged to attend groups. 15 minute checks performed for safety.
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
## 2017-05-29 NOTE — Progress Notes (Signed)
Recreation Therapy Notes  Date: 05/29/17 Time: 1000 Location: 500 Hall Dayroom   Group Topic: Communication, Team Building, Problem Solving  Goal Area(s) Addresses:  Patient will effectively work with peer towards shared goal.  Patient will identify skill used to make activity successful.  Patient will identify how skills used during activity can be used to reach post d/c goals.   Behavioral Response: Engaged  Intervention: STEM Activity   Activity: [employer]. Patients were provided the following materials: 5 drinking straws, 5 rubber bands, 5 paper clips, 2 index cards, 2 drinking cups, and 2 toilet paper rolls. Using the provided materials patients were asked to build a launching mechanisms to launch a ping pong ball approximately 12 feet. Patients were divided into teams of 3-5.   Education:Social Skills, Discharge Planning.   Education Outcome: Acknowledges education/In group clarification offered/Needs additional education.   Clinical Observations/Feedback: Pt worked well with peers.  Pt stated the hardest part of the activity was "coming up with a concept everybody could agree to".  Pt also stated the group had to use teamwork, communication and patience to complete the activity.  Pt also expressed "being on the right medication helps as well as talking to peers and staff because we can learn from each other".  Pt was bright and focused.    Victorino Sparrow, LRT/CTRS           Victorino Sparrow A 05/29/2017 12:33 PM

## 2017-05-29 NOTE — BHH Group Notes (Signed)
LCSW Group Therapy Note   05/29/2017 1:15pm   Type of Therapy and Topic:  Group Therapy:  Positive Affirmations   Participation Level:  Active  Description of Group: This group addressed positive affirmation toward self and others. Patients went around the room and identified two positive things about themselves and two positive things about a peer in the room. Patients reflected on how it felt to share something positive with others, to identify positive things about themselves, and to hear positive things from others. Patients were encouraged to have a daily reflection of positive characteristics or circumstances.  Therapeutic Goals 1. Patient will verbalize two of their positive qualities 2. Patient will demonstrate empathy for others by stating two positive qualities about a peer in the group 3. Patient will verbalize their feelings when voicing positive self affirmations and when voicing positive affirmations of others 4. Patients will discuss the potential positive impact on their wellness/recovery of focusing on positive traits of self and others. Summary of Patient Progress:  Nathan Vincent came to group and was there the entire time. While there he appeared anxious and played with a fidget spinner while listening to the speakers story.  At the end of group he shared his story about why he is in the hospital and that he feels ready to try taking medications to prevent it from happening again.   Therapeutic Modalities Cognitive Behavioral Therapy Motivational Interviewing  Nathan Vincent Work 05/29/2017 1:36 PM

## 2017-05-29 NOTE — Progress Notes (Signed)
dult Psychoeducational Group Note  Date:  05/29/2017 Time:  1620  Group Topic/Focus:  Leisure and lifestyle changes  Participation Level:  Active  Participation Quality:  Appropriate  Affect:  Appropriate  Cognitive:  Appropriate  Insight: Appropriate  Engagement in Group:  Engaged  Modes of Intervention:  Discussion and Education  Additional Comments: Activity: What would you like your life to be like one month from now?
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
## 2017-05-29 NOTE — Tx Team (Addendum)
Interdisciplinary Treatment and Diagnostic Plan Update  05/29/2017 Time of Session: 9:53 AM  Nathan Vincent MRN: [id number]  Principal Diagnosis: Bipolar affective disorder, current episode manic with psychotic symptoms ([place])  Secondary Diagnoses: Active Problems:   Bipolar affective disorder, current episode manic with psychotic symptoms (HCC)   Current Medications:  Current Facility-Administered Medications  Medication Dose Route Frequency Provider Last Rate Last Dose  . acetaminophen (TYLENOL) tablet 650 mg  650 mg Oral Q6H PRN Ethelene Hal, NP   650 mg at 05/29/17 2951  . alum & mag hydroxide-simeth (MAALOX/MYLANTA) 200-200-20 MG/5ML suspension 30 mL  30 mL Oral Q4H PRN Ethelene Hal, NP      . gabapentin (NEURONTIN) capsule 300 mg  300 mg Oral BID Ethelene Hal, NP   300 mg at 05/29/17 8841  . hydrOXYzine (ATARAX/VISTARIL) tablet 10 mg  10 mg Oral BID PRN Ethelene Hal, NP   10 mg at 05/29/17 6606  . magnesium hydroxide (MILK OF MAGNESIA) suspension 30 mL  30 mL Oral Daily PRN Ethelene Hal, NP      . potassium chloride SA (K-DUR,KLOR-CON) CR tablet 40 mEq  40 mEq Oral Daily Ethelene Hal, NP   40 mEq at 05/29/17 0806  . QUEtiapine (SEROQUEL) tablet 50 mg  50 mg Oral QHS PRN,MR X 1 Ethelene Hal, NP   50 mg at 05/28/17 2125    PTA Medications: Medications Prior to Admission  Medication Sig Dispense Refill Last Dose  . acetaminophen (TYLENOL) 500 MG tablet Take 500 mg by mouth every 6 (six) hours as needed for moderate pain.   unknown  . amphetamine-dextroamphetamine (ADDERALL XR) 20 MG 24 hr capsule Take 1 capsule (20 mg total) by mouth 2 (two) times daily. (Patient not taking: Reported on 05/27/2017) 60 capsule 0 Not Taking at Unknown time  . amphetamine-dextroamphetamine (ADDERALL XR) 20 MG 24 hr capsule Take 1 capsule (20 mg total) by mouth 2 (two) times daily. (Patient not taking: Reported on 05/27/2017) 60 capsule 0 Not  Taking at Unknown time  . amphetamine-dextroamphetamine (ADDERALL XR) 20 MG 24 hr capsule Take 1 capsule (20 mg total) by mouth 2 (two) times daily. 60 capsule 0 Past Week at Unknown time  . celecoxib (CELEBREX) 200 MG capsule Take 1 capsule (200 mg total) by mouth 2 (two) times daily. (Patient not taking: Reported on 02/27/2017) 30 capsule 0 Completed Course at Unknown time  . diphenhydrAMINE (BENADRYL) 25 mg capsule Take 25 mg every 6 (six) hours as needed by mouth for allergies.   Past Month at Unknown time  . gabapentin (NEURONTIN) 300 MG capsule Take 1 capsule (300 mg total) by mouth 2 (two) times daily. (Patient not taking: Reported on 02/27/2017) 30 capsule 0 Completed Course at Unknown time  . hydrOXYzine (ATARAX/VISTARIL) 10 MG tablet Take 1 tablet (10 mg total) by mouth 2 (two) times daily as needed for anxiety. 60 tablet 2 Past Month at Unknown time  . methocarbamol (ROBAXIN) 500 MG tablet Take 1 tablet (500 mg total) 3 (three) times daily by mouth. (Patient not taking: Reported on 05/27/2017) 45 tablet 0 Completed Course at Unknown time  . Multiple Vitamin (MULTIVITAMIN WITH MINERALS) TABS tablet Take 1 tablet daily by mouth.   Not Taking at Unknown time  . ondansetron (ZOFRAN) 4 MG tablet Take 1 tablet (4 mg total) by mouth every 6 (six) hours as needed for nausea or vomiting. (Patient not taking: Reported on 02/27/2017) 20 tablet 0 Completed Course at Unknown  time  . oxyCODONE (OXY IR/ROXICODONE) 5 MG immediate release tablet Take 1 tablet (5 mg total) every 4 (four) hours as needed by mouth for moderate pain or severe pain (5 moderate, 10 severe). (Patient not taking: Reported on 05/27/2017) 30 tablet 0 Completed Course at Unknown time  . pantoprazole (PROTONIX) 20 MG tablet Take 1 tablet (20 mg total) by mouth daily. (Patient not taking: Reported on 12/26/2016) 20 tablet 0 Completed Course at Unknown time    Treatment Modalities: Medication Management, Group therapy, Case management,  1 to  1 session with clinician, Psychoeducation, Recreational therapy.  Patient Stressors: Legal issue Medication change or noncompliance Substance abuse  Patient Strengths: Ability for insight Active sense of humor Capable of independent living Communication skills General fund of knowledge Motivation for treatment/growth Supportive family/friends   Physician Treatment Plan for Primary Diagnosis: Bipolar affective disorder, current episode manic with psychotic symptoms ([place]) Long Term Goal(s): Improvement in symptoms so as ready for discharge  Short Term Goals: Ability to identify changes in lifestyle to reduce recurrence of condition will improve Ability to verbalize feelings will improve Ability to demonstrate self-control will improve Ability to identify and develop effective coping behaviors will improve Compliance with prescribed medications will improve Ability to identify triggers associated with substance abuse/mental health issues will improve  Medication Management: Evaluate patient's response, side effects, and tolerance of medication regimen.  Therapeutic Interventions: 1 to 1 sessions, Unit Group sessions and Medication administration.  Evaluation of Outcomes: Progressing  Physician Treatment Plan for Secondary Diagnosis: Active Problems:   Bipolar affective disorder, current episode manic with psychotic symptoms ([place])  Long Term Goal(s): Improvement in symptoms so as ready for discharge  Short Term Goals: Ability to identify changes in lifestyle to reduce recurrence of condition will improve Ability to verbalize feelings will improve Ability to demonstrate self-control will improve Ability to identify and develop effective coping behaviors will improve Compliance with prescribed medications will improve Ability to identify triggers associated with substance abuse/mental health issues will improve  Medication Management: Evaluate patient's response, side effects,  and tolerance of medication regimen.  Therapeutic Interventions: 1 to 1 sessions, Unit Group sessions and Medication administration.  Evaluation of Outcomes: Progressing   RN Treatment Plan for Primary Diagnosis: Bipolar affective disorder, current episode manic with psychotic symptoms ([place]) Long Term Goal(s): Knowledge of disease and therapeutic regimen to maintain health will improve  Short Term Goals: Ability to demonstrate self-control, Ability to participate in decision making will improve, Ability to identify and develop effective coping behaviors will improve and Compliance with prescribed medications will improve  Medication Management: RN will administer medications as ordered by provider, will assess and evaluate patient's response and provide education to patient for prescribed medication. RN will report any adverse and/or side effects to prescribing provider.  Therapeutic Interventions: 1 on 1 counseling sessions, Psychoeducation, Medication administration, Evaluate responses to treatment, Monitor vital signs and CBGs as ordered, Perform/monitor CIWA, COWS, AIMS and Fall Risk screenings as ordered, Perform wound care treatments as ordered.  Evaluation of Outcomes: Progressing   LCSW Treatment Plan for Primary Diagnosis: Bipolar affective disorder, current episode manic with psychotic symptoms ([place]) Long Term Goal(s): Safe transition to appropriate next level of care at discharge, Engage patient in therapeutic group addressing interpersonal concerns.  Short Term Goals: Engage patient in aftercare planning with referrals and resources, Increase ability to appropriately verbalize feelings, Facilitate acceptance of mental health diagnosis and concerns, Identify triggers associated with mental health/substance abuse issues and Increase skills for wellness and  recovery  Therapeutic Interventions: Assess for all discharge needs, 1 to 1 time with Education officer, museum, Explore available resources  and support systems, Assess for adequacy in community support network, Educate family and significant other(s) on suicide prevention, Complete Psychosocial Assessment, Interpersonal group therapy.  Evaluation of Outcomes: Progressing   Progress in Treatment: Attending groups: Yes Participating in groups: Yes Taking medication as prescribed: Yes Toleration of medication: Yes, no side effects reported at this time Family/Significant other contact made: Yes, Nathan Vincent, Sr, [phone] (Father) Patient understands diagnosis: No, limited insight  Discussing patient identified problems/goals with staff: Yes Medical problems stabilized or resolved: Yes Denies suicidal/homicidal ideation: Yes Issues/concerns per patient self-inventory: None Other: N/A  New problem(s) identified: None identified at this time.   New Short Term/Long Term Goal(s): "I want to be diligent about taking my meds that are being prescribed by professionals. I want to participate in groups because we all have a different perspectives. Discharge Plan or Barriers: Upon discharge pt will return home with his father and follow up with Dr. Doyne Keel at [facility]    Reason for Continuation of Hospitalization: Anxiety Mania Medication stabilization   Estimated Length of Stay: 06/03/17  Attendees: Patient: Nathan Vincent  05/29/2017  9:53 AM  Physician: Maris Berger, MD 05/29/2017  9:53 AM  Nursing: Jonette Mate, RN 05/29/2017  9:53 AM  RN Care Manager: Lars Pinks, RN 05/29/2017  9:53 AM  Social Worker: Ripley Fraise, LCSW; Verdis Frederickson, Social Work Intern 05/29/2017  9:53 AM  Recreational Therapist: Victorino Sparrow, LRT 05/29/2017  9:53 AM  Other: Norberto Sorenson, [place] 05/29/2017  9:53 AM  Other:  05/29/2017  9:53 AM  Other: 05/29/2017  9:53 AM    Scribe for Treatment Team: Darleen Crocker, Student-Social Work 05/29/2017 9:53 AM

## 2017-05-29 NOTE — Progress Notes (Signed)
Goals group  Attended and participated.   Patient set goal as To take medications, stay med compliant after discharge while being awesome.

## 2017-05-30 DIAGNOSIS — R451 Restlessness and agitation: Secondary | ICD-10-CM

## 2017-05-30 MED ORDER — ARIPIPRAZOLE 15 MG PO TABS
15.0000 mg | ORAL_TABLET | Freq: Every day | ORAL | Status: DC
  Administered 2017-05-31 – 2017-06-02 (×3): 15 mg via ORAL
  Filled 2017-05-30 (×5): qty 1

## 2017-05-30 NOTE — Progress Notes (Signed)
Adult Psychoeducational Group Note  Date:  05/30/2017 Time:  4:06 PM  Group Topic/Focus:  Recovery Goals:   The focus of this group is to identify appropriate goals for recovery and establish a plan to achieve them.  Participation Level:  Active  Participation Quality:  Appropriate, Attentive and Supportive  Affect:  Appropriate  Cognitive:  Alert, Appropriate and Oriented  Insight: Appropriate and Good  Engagement in Group:  Engaged, Improving and Supportive  Modes of Intervention:  Discussion, Education and Support  Additional Comments:  States goal for today is to keep is creative skills going.  Coralyn Mark Derick [place] 05/30/2017, 4:06 PM

## 2017-05-30 NOTE — Progress Notes (Signed)
D:  A & O X3. Denies SI, HI and  AVH. Observed pacing in milieu at intervals. Presents fidgety and restless on approach. Rates his depression 0/10, hopelessness 0/10 and anxiety 4/10 on self inventory sheet. Reports fair sleep with good appetite, normal energy and good concentration level. A: Scheduled and PRN (Tylenol & Vistaril) medications given per MD's orders with verbal education and effects monitored. Emotional support and encouragement provided to pt. Safety checks maintained at Q 15 minutes intervals without self harm gestures or outburst thus far. R: Pt remains medication compliant. Reports hand tremors, "I got black spots under my eye but I do feel better when assessed. Attended schedule unit groups this shift. when prompted and was actively engaged. Tolerates all PO intake well. Cooperative with care at present. Remains safe on and off unit.

## 2017-05-30 NOTE — Progress Notes (Signed)
Patient ID: Nathan Vincent, male   DOB: 1995/09/20, 22 y.o.   MRN: [id number]  DAR Note: Pt unable to sit still, observed pacing the hallway. Pt endorsed mild back pain, moderate anxiety and shakiness; "I feel my whole body shaking." Denied depression, AVH, HI or SI; "I don't feel that anymore." Support, encouragement, and safe environment provided.  15-minute safety checks continue. Pt attended wrap-up group. Pt was med compliant.

## 2017-05-30 NOTE — Progress Notes (Signed)
Recreation Therapy Notes  Date: 05/30/17 Time: 1000 Location: 500 Hall Dayroom  Group Topic: Goal Setting  Goal Area(s) Addresses:  Patient will be able to identify at least 3 life goals.  Patient will be able to identify benefit of investing in life goals.  Patient will be able to identify benefit of setting life goals.   Behavioral Response: Engaged  Intervention: Worksheet, pencils  Activity: Life Goals.  Patients were given a worksheet broken down into 6 categories (family, friends, work/school, spirituality, body and mental health).  Patients were to then identify what they are doing well, where they need to improve and set a goal to make the improvement.  Education: Discharge Planning, Coping Skills  Education Outcome: Acknowledges Education/In Group Clarification Provided/Needs Additional Education  Clinical Observations: Pt stated for family he does well at communicating and getting along with them, needs to improve bonding time with them and set a goal to initiate time with them; work/school good at being patient, needs to improve getting in the groove of going to school/work and set a goal of going to school in August; mental health- good at knowing who he is, needs to have less panic attacks and set a goal of being calm and collected.    Victorino Sparrow, LRT/CTRS     Victorino Sparrow A 05/30/2017 12:53 PM
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
## 2017-05-30 NOTE — [facility] Group Notes (Signed)
[facility] LCSW Group Therapy 05/30/2017 1:15pm  Type of Therapy: Group Therapy- Feelings Around Discharge & Establishing a Supportive Framework  Participation Level:  Active  Description of Group:   What is a supportive framework? What does it look like feel like and how do I discern it from and unhealthy non-supportive network? Learn how to cope when supports are not helpful and don't support you. Discuss what to do when your family/friends are not supportive.  Summary of Patient Progress  Nathan Vincent attended group and appeared interested in the topic.  He was polite and helpful in cleaning up the room during group.  He feels that courage is standing your ground and not giving in to other people. He also feels that courage is admitting when you need help from others around you.  When asked to choose a picture he chose a picture of a man on a motorcycle and described courage as having confidence to do things that seem risky but continuing to try it regardless of being afraid to fail.    Therapeutic Modalities:   Cognitive Behavioral Therapy Person-Centered Therapy Motivational Interviewing   [name] Work 05/30/2017 10:24 AM

## 2017-05-31 DIAGNOSIS — F1290 Cannabis use, unspecified, uncomplicated: Secondary | ICD-10-CM

## 2017-05-31 DIAGNOSIS — F312 Bipolar disorder, current episode manic severe with psychotic features: Principal | ICD-10-CM

## 2017-05-31 DIAGNOSIS — G4700 Insomnia, unspecified: Secondary | ICD-10-CM

## 2017-05-31 DIAGNOSIS — R450 Nervousness: Secondary | ICD-10-CM

## 2017-05-31 DIAGNOSIS — F1099 Alcohol use, unspecified with unspecified alcohol-induced disorder: Secondary | ICD-10-CM

## 2017-05-31 DIAGNOSIS — F17210 Nicotine dependence, cigarettes, uncomplicated: Secondary | ICD-10-CM

## 2017-05-31 DIAGNOSIS — Z560 Unemployment, unspecified: Secondary | ICD-10-CM

## 2017-05-31 DIAGNOSIS — F419 Anxiety disorder, unspecified: Secondary | ICD-10-CM

## 2017-05-31 MED ORDER — GABAPENTIN 300 MG PO CAPS
300.0000 mg | ORAL_CAPSULE | Freq: Three times a day (TID) | ORAL | Status: DC
Start: 2017-05-31 — End: 2017-06-02
  Administered 2017-05-31 – 2017-06-02 (×7): 300 mg via ORAL
  Filled 2017-05-31 (×12): qty 1

## 2017-05-31 MED ORDER — HYDROXYZINE HCL 50 MG PO TABS
50.0000 mg | ORAL_TABLET | Freq: Four times a day (QID) | ORAL | Status: DC | PRN
Start: 2017-05-31 — End: 2017-06-02
  Administered 2017-05-31 – 2017-06-02 (×3): 50 mg via ORAL
  Filled 2017-05-31 (×3): qty 1

## 2017-05-31 MED ORDER — TRAZODONE HCL 100 MG PO TABS
100.0000 mg | ORAL_TABLET | Freq: Every evening | ORAL | Status: DC | PRN
  Administered 2017-05-31 – 2017-06-01 (×2): 100 mg via ORAL
  Filled 2017-05-31 (×2): qty 1

## 2017-05-31 MED ORDER — LORAZEPAM 0.5 MG PO TABS
0.5000 mg | ORAL_TABLET | Freq: Two times a day (BID) | ORAL | Status: DC
  Administered 2017-05-31: 0.5 mg via ORAL
  Filled 2017-05-31: qty 1

## 2017-05-31 MED ORDER — LORAZEPAM 0.5 MG PO TABS
0.5000 mg | ORAL_TABLET | Freq: Four times a day (QID) | ORAL | Status: DC | PRN
  Administered 2017-06-01 – 2017-06-02 (×4): 0.5 mg via ORAL
  Filled 2017-05-31 (×4): qty 1

## 2017-05-31 NOTE — Progress Notes (Signed)
Patient ID: Nathan Vincent, male   DOB: [birth year], 22 y.o.   MRN: [id number] DAR Note: Pt appeared to be anxious on the unit, endorsed moderate anxiety; "I'm still a little anxious." Pt denied depression, AVH, HI or SI; "I have a lot to live for; I hope I can go home soon." Support, encouragement, and safe environment provided.  15-minute safety checks continue. Pt attended wrap-up group. Pt was med compliant.

## 2017-05-31 NOTE — [facility] Group Notes (Signed)
[facility] Group Notes:  (Nursing/MHT/Case Management/Adjunct)  Date:  05/31/2017  Time:  3:39 PM  Type of Therapy:  Psychoeducational Skills  Participation Level:  Active  Participation Quality:  Appropriate  Affect:  Appropriate  Cognitive:  Appropriate  Insight:  Appropriate  Engagement in Group:  Engaged  Modes of Intervention:  Role-play  Summary of Progress/Problems: Topic of group was triggers for anger, pt was receptive.  Benancio Deeds [name] 05/31/2017, 3:39 PM

## 2017-05-31 NOTE — Plan of Care (Signed)
  Safety: Periods of time without injury will increase 05/31/2017 2146 - Progressing by Providence Crosby, RN Note Pt safe on the unit at this time

## 2017-05-31 NOTE — [facility] Group Notes (Signed)
  [facility] LCSW Group Therapy Note  Date/Time:  05/31/2017 11:15AM-12:00PM  Type of Therapy and Topic:  Group Therapy:  Feelings About Hospitalization  Participation Level:  Active   Description of Group This process group involved patients discussing their feelings related to being hospitalized, as well as the benefits they see to being in the hospital.  These feelings and benefits were itemized.  The group then brainstormed specific ways in which they could seek those same benefits when they discharge and return home.  Therapeutic Goals 1. Patient will identify and describe positive and negative feelings related to hospitalization 2. Patient will verbalize benefits of hospitalization to themselves personally 3. Patients will brainstorm together ways they can obtain similar benefits in the outpatient setting, identify barriers to wellness and possible solutions  Summary of Patient Progress:  The patient expressed his primary feelings about being hospitalized are that he "doesn't mind" it because it is helpful and interesting.  He talked at length about how he has realized he needs to stay on medications and really liked the idea of using a pillbox/weekly planner for this.  He also said he needs to listen to others, for instance his father, when they tell him that something is wrong with him.  He was very encouraging to other patients throughout group.  Therapeutic Modalities Cognitive Behavioral Therapy Motivational Interviewing    Selmer Dominion, LCSW 05/31/2017, 1:33 PM

## 2017-05-31 NOTE — Plan of Care (Signed)
Patient is safe and free from injury.  Compliant with prescribed medications.  Attended group and participated.  Patient is visible in milieu interacting with staff and peers.

## 2017-05-31 NOTE — Progress Notes (Signed)
Adult Psychoeducational Group Note  Date:  05/31/2017 Time:  9:24 PM  Group Topic/Focus:  Wrap-Up Group:   The focus of this group is to help patients review their daily goal of treatment and discuss progress on daily workbooks.  Participation Level:  Active  Participation Quality:  Appropriate  Affect:  Appropriate  Cognitive:  Appropriate  Insight: Appropriate  Engagement in Group:  Engaged  Modes of Intervention:  Discussion  Additional Comments:  The patient expressed he rates today a 10.The patient also said that enjoyed recreation group.  Nash Shearer 05/31/2017, 9:24 PM

## 2017-05-31 NOTE — Progress Notes (Signed)
D: Pt denies SI/HI/AVH. Pt is pleasant and cooperative. Pt stated he was doing better, pt said he felt more even.   A: Pt was offered support and encouragement. Pt was given scheduled medications. Pt was encourage to attend groups. Q 15 minute checks were done for safety.   R:Pt attends groups and interacts well with peers and staff. Pt is taking medication. Pt has no complaints.Pt receptive to treatment and safety maintained on unit.
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
## 2017-05-31 NOTE — BHH Group Notes (Signed)
[facility] Group Notes:  (Nursing/MHT/Case Management/Adjunct)  Date:  05/31/2017  Time:  12:07 PM  Type of Therapy:  Psychoeducational Skills  Participation Level:  Active  Participation Quality:  Appropriate  Affect:  Appropriate  Cognitive:  Appropriate  Insight:  Appropriate  Engagement in Group:  Engaged  Modes of Intervention:  Role-play  Summary of Progress/Problems: Topic of group was assertiveness, pt was receptive.   Benancio Deeds [name] 05/31/2017, 12:07 PM

## 2017-06-01 MED ORDER — ENSURE ENLIVE PO LIQD
237.0000 mL | Freq: Two times a day (BID) | ORAL | Status: DC
  Administered 2017-06-01: 237 mL via ORAL

## 2017-06-01 MED ORDER — ONDANSETRON 4 MG PO TBDP
4.0000 mg | ORAL_TABLET | Freq: Three times a day (TID) | ORAL | Status: DC | PRN
  Administered 2017-06-01 (×2): 4 mg via ORAL
  Filled 2017-06-01 (×2): qty 1

## 2017-06-01 NOTE — Progress Notes (Signed)
D: Pt denies SI/HI/AVH. Pt is pleasant and cooperative. Pt stated he was feeling better by the day, pt said he had a good visit with his dad tonight. Pt visible on unit interacting with peers.   A: Pt was offered support and encouragement. Pt was given scheduled medications. Pt was encourage to attend groups. Q 15 minute checks were done for safety.   R:Pt attends groups and interacts well with peers and staff. Pt is taking medication. Pt has no complaints.Pt receptive to treatment and safety maintained on unit.
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
## 2017-06-01 NOTE — BHH Group Notes (Signed)
[facility] Group Notes:  (Nursing/MHT/Case Management/Adjunct)  Date:  06/01/2017  Time:  11:52 AM  Type of Therapy:  Psychoeducational Skills  Participation Level:  Active  Participation Quality:  Appropriate  Affect:  Appropriate  Cognitive:  Appropriate  Insight:  Appropriate  Engagement in Group:  Engaged  Modes of Intervention:  Problem-solving  Summary of Progress/Problems: Topic was healthy support group.  Group encouraged to surround themselves with positive and healthy group/support system when changing to a healthy life style.    Nathan Vincent 06/01/2017, 11:52 AM

## 2017-06-01 NOTE — BHH Group Notes (Signed)
[facility] LCSW Group Therapy Note  Date/Time:  06/01/2017  11:00AM-12:00PM  Type of Therapy and Topic:  Group Therapy:  Music and Mood  Participation Level:  Active   Description of Group: In this process group, members listened to a variety of genres of music and identified that different types of music evoke different responses.  Patients were encouraged to identify music that was soothing for them and music that was energizing for them.  Patients discussed how this knowledge can help with wellness and recovery in various ways including managing depression and anxiety as well as encouraging healthy sleep habits.    Therapeutic Goals: 1. Patients will explore the impact of different varieties of music on mood 2. Patients will verbalize the thoughts they have when listening to different types of music 3. Patients will identify music that is soothing to them as well as music that is energizing to them 4. Patients will discuss how to use this knowledge to assist in maintaining wellness and recovery 5. Patients will explore the use of music as a coping skill  Summary of Patient Progress:  At the beginning of group, patient expressed that he felt "chill" and he enjoyed the hour, said he felt very relaxed at the end.  Therapeutic Modalities: Solution Focused Brief Therapy Activity   Selmer Dominion, LCSW

## 2017-06-01 NOTE — BHH Group Notes (Signed)
[facility] Group Notes:  (Nursing/MHT/Case Management/Adjunct)  Date:  06/01/2017  Time:  3:38 PM  Type of Therapy:  Psychoeducational Skills  Participation Level:  Active  Participation Quality:  Appropriate and Attentive  Affect:  Appropriate  Cognitive:  Alert and Appropriate  Insight:  Appropriate and Good  Engagement in Group:  Engaged and Supportive  Modes of Intervention:  Discussion and Education  Summary of Progress/Problems: In group where healthy support systems was the topic, patient mentioned that his support systems were his cat and his father.   Patriece Archbold J Derren Suydam 06/01/2017, 3:38 PM

## 2017-06-01 NOTE — Plan of Care (Signed)
  Self-Concept: Level of anxiety will decrease 06/01/2017 2349 - Progressing by Providence Crosby, RN Note Pt stated his anxiety was decreased  from yesterday
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
## 2017-06-02 MED ORDER — GABAPENTIN 300 MG PO CAPS: 300.0000 mg | ORAL_CAPSULE | Freq: Three times a day (TID) | ORAL | 0 refills | Status: DC

## 2017-06-02 MED ORDER — ACETAMINOPHEN 500 MG PO TABS: 500.0000 mg | ORAL_TABLET | Freq: Four times a day (QID) | ORAL | 0 refills | Status: DC | PRN

## 2017-06-02 MED ORDER — ARIPIPRAZOLE 15 MG PO TABS: 15.0000 mg | ORAL_TABLET | Freq: Every day | ORAL | 0 refills | Status: DC

## 2017-06-02 MED ORDER — ONDANSETRON HCL 4 MG PO TABS: 4.0000 mg | ORAL_TABLET | Freq: Four times a day (QID) | ORAL | 0 refills | Status: DC | PRN

## 2017-06-02 MED ORDER — NICOTINE 21 MG/24HR TD PT24: 21.0000 mg | MEDICATED_PATCH | Freq: Every day | TRANSDERMAL | 0 refills | Status: DC

## 2017-06-02 MED ORDER — HYDROXYZINE HCL 50 MG PO TABS: 50.0000 mg | ORAL_TABLET | Freq: Four times a day (QID) | ORAL | 0 refills | Status: DC | PRN

## 2017-06-02 MED ORDER — TRAZODONE HCL 100 MG PO TABS: 100.0000 mg | ORAL_TABLET | Freq: Every evening | ORAL | 0 refills | Status: DC | PRN

## 2017-06-02 NOTE — Progress Notes (Signed)
Pt d/c from the hospital with his father. All items returned. D/C instructions given and prescriptions given. Pt denies si and hi.

## 2017-06-02 NOTE — Progress Notes (Signed)
Recreation Therapy Notes  Date: 06/02/17 Time: 1000 Location: 500 Hall Dayroom  Group Topic: Anger Management  Goal Area(s) Addresses:  Patient will identify triggers for anger.  Patient will identify physical reaction to anger.   Patient will identify benefit of using coping skills when angry.  Behavioral Response: Engaged  Intervention: Worksheet, pencils  Activity: Introduction to Anger Management.  Patients were given a worksheet where they were to identify at least 3 situations/topics/people that lead to anger, how they react to anger and problems caused because of anger.  Patients were also asked to identify coping skills they use to deal with anger.  Education: Anger Management, Discharge Planning   Education Outcome: Acknowledges education/In group clarification offered/Needs additional education.   Clinical Observations/Feedback: Pt stated anger isn't a bad thing because "it's a natural emotion".  Pt stated some signs of anger can be pacing or face turning red.  Pt stated bad drivers, people mistreating animals and people speaking to him like he is dumb makes him angry.  Pt stated he shows his anger by "shouting, pacing intensely and aggressive responses".  Pt also expressed his anger has cost him friendships, relationships, scared his cat and makes scenes in public.  Pt expressed he uses taking a step back, breathing, leaving the situation and aromatherapy as coping skills.    Victorino Sparrow, LRT/CTRS     Victorino Sparrow A 06/02/2017 12:42 PM

## 2017-06-02 NOTE — Progress Notes (Signed)
Adult Psychoeducational Group Note  Date:  06/02/2017 Time:  12:11 AM  Group Topic/Focus:  Wrap-Up Group:   The focus of this group is to help patients review their daily goal of treatment and discuss progress on daily workbooks.  Participation Level:  Active  Participation Quality:  Appropriate  Affect:  Appropriate  Cognitive:  Appropriate  Insight: Appropriate  Engagement in Group:  Engaged  Modes of Intervention:  Discussion  Additional Comments: Pt stated his goal for today was to work on his nutrition. Pt stated that he accomplished this goal today. Pt rated his over all day a 10. Pt stated his visitation with his family help improve his day. Pt stated he really enjoyed the music group today.  Nathan Vincent 06/02/2017, 12:11 AM

## 2017-06-02 NOTE — Progress Notes (Signed)
  [facility] Adult Case Management Discharge Plan :  Will you be returning to the same living situation after discharge:  No. Pt reports he will be staying with his father. At discharge, do you have transportation home?: Yes,  father Do you have the ability to pay for your medications: Yes,  [employer]  Release of information consent forms completed and in the chart;  Patient's signature needed at discharge.  Patient to Follow up at: Follow-up Information    BEHAVIORAL HEALTH OUTPATIENT [facility]ER[facility] [place] Follow up on 06/05/2017.   Specialty:  Behavioral Health Why:  Follow up for medication management is scheduled on 06/05/17 at 4:15 with Dr. Doyne Keel.  Contact information: [place] [id number] [place] [phone]          Next level of care provider has access to Hand and Suicide Prevention discussed: Yes,  father  Have you used any form of tobacco in the last 30 days? (Cigarettes, Smokeless Tobacco, Cigars, and/or Pipes): Yes  Has patient been referred to the Quitline?: Patient refused referral  Patient has been referred for addiction treatment: Yes  Joanne Chars, [place] 06/02/2017, 10:20 AM
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
## 2017-06-02 NOTE — Progress Notes (Signed)
Pt did not attend afernoon psychoed group, remained in his room throughout the hour.

## 2017-06-02 NOTE — Progress Notes (Signed)
Adult Psychoeducational Group Note  Date:  06/02/2017 Time:  11:38 AM  Group Topic/Focus:  Goals Group:   The focus of this group is to help patients establish daily goals to achieve during treatment and discuss how the patient can incorporate goal setting into their daily lives to aide in recovery.  Participation Level:  Active  Participation Quality:  Appropriate  Affect:  Appropriate  Cognitive:  Alert and Appropriate  Insight: Appropriate and Good  Engagement in Group:  Engaged  Modes of Intervention:  Activity and Discussion  Additional Comments:  Pt attended goals/orientation group this morning and participated in group. Pt goal for today is to prepare for discharge. Pt shared what he learned while in the hospital with peers. pt stated " I am so happy to be leaving today". Staff was able to discuss the treatment plan, safety, and rules for the unit. Pt was in engaged and appropriate in group. Pt denies SI/HI at this time. Pt rated his day 9/10.   Nathan Vincent A 06/02/2017, 11:38 AM

## 2017-06-02 NOTE — Progress Notes (Signed)
Recreation Therapy Notes  INPATIENT RECREATION TR PLAN  Patient Details Name: Nathan Vincent MRN: [id number] DOB: [birth year] Today's Date: 06/02/2017  Rec Therapy Plan Is patient appropriate for Therapeutic Recreation?: Yes Treatment times per week: about 3 days Estimated Length of Stay: 5-7 days TR Treatment/Interventions: Group participation (Comment)  Discharge Criteria Pt will be discharged from therapy if:: Discharged Treatment plan/goals/alternatives discussed and agreed upon by:: Patient/family  Discharge Summary Short term goals set: Pt will be able to show understanding of at least 2 stress management techniques. Short term goals met: Adequate for discharge Progress toward goals comments: Groups attended Which groups?: Goal setting, Anger management, Other (Comment)(Team building) Reason goals not met: None Therapeutic equipment acquired: N/A Reason patient discharged from therapy: Discharge from hospital Pt/family agrees with progress & goals achieved: Yes Date patient discharged from therapy: 06/02/17    Victorino Sparrow, LRT/CTRS  [name], Devlon Dosher A 06/02/2017, 1:31 PM

## 2017-06-02 NOTE — Plan of Care (Signed)
Pt showed and understanding of methods to use to relieve stress.    Victorino Sparrow, LRT/CTRS
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
## 2017-06-05 ENCOUNTER — Ambulatory Visit (INDEPENDENT_AMBULATORY_CARE_PROVIDER_SITE_OTHER): Payer: BLUE CROSS/BLUE SHIELD | Admitting: Psychiatry

## 2017-06-05 ENCOUNTER — Encounter (HOSPITAL_COMMUNITY): Payer: Self-pay | Admitting: Psychiatry

## 2017-06-05 VITALS — BP 110/74 | HR 68 | Ht 70.0 in | Wt 137.0 lb

## 2017-06-05 DIAGNOSIS — F1099 Alcohol use, unspecified with unspecified alcohol-induced disorder: Secondary | ICD-10-CM | POA: Diagnosis not present

## 2017-06-05 DIAGNOSIS — F319 Bipolar disorder, unspecified: Secondary | ICD-10-CM

## 2017-06-05 DIAGNOSIS — F4310 Post-traumatic stress disorder, unspecified: Secondary | ICD-10-CM

## 2017-06-05 DIAGNOSIS — F902 Attention-deficit hyperactivity disorder, combined type: Secondary | ICD-10-CM | POA: Diagnosis not present

## 2017-06-05 DIAGNOSIS — G4700 Insomnia, unspecified: Secondary | ICD-10-CM | POA: Diagnosis not present

## 2017-06-05 DIAGNOSIS — F17210 Nicotine dependence, cigarettes, uncomplicated: Secondary | ICD-10-CM | POA: Diagnosis not present

## 2017-06-05 DIAGNOSIS — F411 Generalized anxiety disorder: Secondary | ICD-10-CM

## 2017-06-05 DIAGNOSIS — F1290 Cannabis use, unspecified, uncomplicated: Secondary | ICD-10-CM | POA: Diagnosis not present

## 2017-06-05 DIAGNOSIS — Z560 Unemployment, unspecified: Secondary | ICD-10-CM | POA: Diagnosis not present

## 2017-06-05 DIAGNOSIS — F17200 Nicotine dependence, unspecified, uncomplicated: Secondary | ICD-10-CM

## 2017-06-05 MED ORDER — GABAPENTIN 300 MG PO CAPS: 300.0000 mg | ORAL_CAPSULE | Freq: Three times a day (TID) | ORAL | 0 refills | Status: DC

## 2017-06-05 MED ORDER — NICOTINE 21 MG/24HR TD PT24: 21.0000 mg | MEDICATED_PATCH | Freq: Every day | TRANSDERMAL | 0 refills | Status: DC

## 2017-06-05 MED ORDER — AMPHETAMINE-DEXTROAMPHET ER 20 MG PO CP24: 20.0000 mg | ORAL_CAPSULE | Freq: Two times a day (BID) | ORAL | 0 refills | Status: DC

## 2017-06-05 MED ORDER — ARIPIPRAZOLE 15 MG PO TABS: 15.0000 mg | ORAL_TABLET | Freq: Every day | ORAL | 0 refills | Status: DC
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
## 2017-06-30 ENCOUNTER — Other Ambulatory Visit (HOSPITAL_COMMUNITY): Payer: Self-pay

## 2017-06-30 DIAGNOSIS — F411 Generalized anxiety disorder: Secondary | ICD-10-CM

## 2017-06-30 DIAGNOSIS — F4310 Post-traumatic stress disorder, unspecified: Secondary | ICD-10-CM

## 2017-06-30 DIAGNOSIS — F319 Bipolar disorder, unspecified: Secondary | ICD-10-CM

## 2017-06-30 MED ORDER — GABAPENTIN 300 MG PO CAPS: 300.0000 mg | ORAL_CAPSULE | Freq: Three times a day (TID) | ORAL | 0 refills | Status: DC

## 2017-06-30 MED ORDER — ARIPIPRAZOLE 15 MG PO TABS: 15.0000 mg | ORAL_TABLET | Freq: Every day | ORAL | 0 refills | Status: DC

## 2017-06-30 MED ORDER — TRAZODONE HCL 100 MG PO TABS: 100.0000 mg | ORAL_TABLET | Freq: Every evening | ORAL | 0 refills | Status: DC | PRN

## 2017-07-10 ENCOUNTER — Ambulatory Visit (INDEPENDENT_AMBULATORY_CARE_PROVIDER_SITE_OTHER): Payer: BLUE CROSS/BLUE SHIELD | Admitting: Psychiatry

## 2017-07-10 VITALS — BP 122/70 | HR 88 | Ht 72.0 in | Wt 151.0 lb

## 2017-07-10 DIAGNOSIS — F4310 Post-traumatic stress disorder, unspecified: Secondary | ICD-10-CM | POA: Diagnosis not present

## 2017-07-10 DIAGNOSIS — F902 Attention-deficit hyperactivity disorder, combined type: Secondary | ICD-10-CM

## 2017-07-10 DIAGNOSIS — F17210 Nicotine dependence, cigarettes, uncomplicated: Secondary | ICD-10-CM | POA: Diagnosis not present

## 2017-07-10 DIAGNOSIS — F99 Mental disorder, not otherwise specified: Secondary | ICD-10-CM

## 2017-07-10 DIAGNOSIS — M549 Dorsalgia, unspecified: Secondary | ICD-10-CM

## 2017-07-10 DIAGNOSIS — F411 Generalized anxiety disorder: Secondary | ICD-10-CM

## 2017-07-10 DIAGNOSIS — F1290 Cannabis use, unspecified, uncomplicated: Secondary | ICD-10-CM

## 2017-07-10 DIAGNOSIS — F319 Bipolar disorder, unspecified: Secondary | ICD-10-CM

## 2017-07-10 DIAGNOSIS — F5105 Insomnia due to other mental disorder: Secondary | ICD-10-CM

## 2017-07-10 DIAGNOSIS — Z560 Unemployment, unspecified: Secondary | ICD-10-CM

## 2017-07-10 DIAGNOSIS — F1099 Alcohol use, unspecified with unspecified alcohol-induced disorder: Secondary | ICD-10-CM

## 2017-07-10 MED ORDER — HYDROXYZINE HCL 50 MG PO TABS: 50.0000 mg | ORAL_TABLET | Freq: Four times a day (QID) | ORAL | 0 refills | Status: DC | PRN

## 2017-07-10 MED ORDER — ARIPIPRAZOLE 20 MG PO TABS: 20.0000 mg | ORAL_TABLET | Freq: Every day | ORAL | 0 refills | Status: DC

## 2017-07-10 MED ORDER — TRAZODONE HCL 100 MG PO TABS: 100.0000 mg | ORAL_TABLET | Freq: Every evening | ORAL | 0 refills | Status: DC | PRN

## 2017-07-10 MED ORDER — AMPHETAMINE-DEXTROAMPHET ER 20 MG PO CP24: 20.0000 mg | ORAL_CAPSULE | Freq: Two times a day (BID) | ORAL | 0 refills | Status: DC
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
## 2017-07-15 ENCOUNTER — Encounter (HOSPITAL_COMMUNITY): Payer: Self-pay | Admitting: *Deleted

## 2017-07-15 ENCOUNTER — Other Ambulatory Visit: Payer: Self-pay

## 2017-07-15 ENCOUNTER — Emergency Department (HOSPITAL_COMMUNITY)
Admission: EM | Admit: 2017-07-15 | Discharge: 2017-07-15 | Disposition: A | Discharge status: Home / Self Care | Payer: BLUE CROSS/BLUE SHIELD | Attending: Emergency Medicine | Admitting: Emergency Medicine

## 2017-07-15 DIAGNOSIS — Z79899 Other long term (current) drug therapy: Secondary | ICD-10-CM | POA: Insufficient documentation

## 2017-07-15 DIAGNOSIS — F17210 Nicotine dependence, cigarettes, uncomplicated: Secondary | ICD-10-CM | POA: Insufficient documentation

## 2017-07-15 DIAGNOSIS — Z008 Encounter for other general examination: Secondary | ICD-10-CM | POA: Insufficient documentation

## 2017-07-15 DIAGNOSIS — F314 Bipolar disorder, current episode depressed, severe, without psychotic features: Secondary | ICD-10-CM | POA: Diagnosis present

## 2017-07-15 DIAGNOSIS — F312 Bipolar disorder, current episode manic severe with psychotic features: Secondary | ICD-10-CM | POA: Insufficient documentation

## 2017-07-15 LAB — ACETAMINOPHEN LEVEL: Acetaminophen (Tylenol), Serum: <10 ug/mL — ABNORMAL LOW (ref 10–30)

## 2017-07-15 LAB — CBC WITH DIFFERENTIAL/PLATELET
Basophils Absolute: 0 10*3/uL (ref 0.0–0.1)
Basophils Relative: 0 %
Eosinophils Absolute: 0.2 10*3/uL (ref 0.0–0.7)
Eosinophils Relative: 2 %
HCT: 40.5 % (ref 39.0–52.0)
HEMOGLOBIN: 14 g/dL (ref 13.0–17.0)
LYMPHS ABS: 3.6 10*3/uL (ref 0.7–4.0)
LYMPHS PCT: 40 %
MCH: 30.5 pg (ref 26.0–34.0)
MCHC: 34.6 g/dL (ref 30.0–36.0)
MCV: 88.2 fL (ref 78.0–100.0)
MONOS PCT: 7 %
Monocytes Absolute: 0.6 10*3/uL (ref 0.1–1.0)
NEUTROS PCT: 51 %
Neutro Abs: 4.6 10*3/uL (ref 1.7–7.7)
Platelets: 330 10*3/uL (ref 150–400)
RBC: 4.59 MIL/uL (ref 4.22–5.81)
RDW: 13.4 % (ref 11.5–15.5)
WBC: 9 10*3/uL (ref 4.0–10.5)

## 2017-07-15 LAB — COMPREHENSIVE METABOLIC PANEL
ALT: 14 U/L — ABNORMAL LOW (ref 17–63)
AST: 19 U/L (ref 15–41)
Albumin: 3.9 g/dL (ref 3.5–5.0)
Alkaline Phosphatase: 77 U/L (ref 38–126)
Anion gap: 11 (ref 5–15)
BILIRUBIN TOTAL: 0.6 mg/dL (ref 0.3–1.2)
BUN: 6 mg/dL (ref 6–20)
CO2: 25 mmol/L (ref 22–32)
CREATININE: 0.79 mg/dL (ref 0.61–1.24)
Calcium: 9 mg/dL (ref 8.9–10.3)
Chloride: 105 mmol/L (ref 101–111)
Glucose, Bld: 116 mg/dL — ABNORMAL HIGH (ref 65–99)
POTASSIUM: 3.3 mmol/L — AB (ref 3.5–5.1)
Sodium: 141 mmol/L (ref 135–145)
TOTAL PROTEIN: 7.1 g/dL (ref 6.5–8.1)

## 2017-07-15 LAB — RAPID URINE DRUG SCREEN, HOSP PERFORMED
Amphetamines: NOT DETECTED
Barbiturates: NOT DETECTED
Benzodiazepines: NOT DETECTED
Cocaine: NOT DETECTED
OPIATES: NOT DETECTED
Tetrahydrocannabinol: POSITIVE — AB

## 2017-07-15 LAB — SALICYLATE LEVEL: Salicylate Lvl: <7 mg/dL (ref 2.8–30.0)

## 2017-07-15 LAB — ETHANOL: ALCOHOL ETHYL (B): 92 mg/dL — AB (ref <10)

## 2017-07-15 MED ORDER — TRAZODONE HCL 100 MG PO TABS: 100.0000 mg | ORAL_TABLET | Freq: Every evening | ORAL | Status: DC | PRN

## 2017-07-15 MED ORDER — ACETAMINOPHEN 325 MG PO TABS: 650.0000 mg | ORAL_TABLET | ORAL | Status: DC | PRN

## 2017-07-15 MED ORDER — AMPHETAMINE-DEXTROAMPHET ER 10 MG PO CP24: 20.0000 mg | ORAL_CAPSULE | Freq: Two times a day (BID) | ORAL | Status: DC

## 2017-07-15 MED ORDER — ARIPIPRAZOLE 5 MG PO TABS
15.0000 mg | ORAL_TABLET | Freq: Every day | ORAL | Status: DC
  Administered 2017-07-15: 09:00:00 15 mg via ORAL
  Filled 2017-07-15: qty 1

## 2017-07-15 MED ORDER — POTASSIUM CHLORIDE CRYS ER 20 MEQ PO TBCR
40.0000 meq | EXTENDED_RELEASE_TABLET | Freq: Once | ORAL | Status: AC
  Administered 2017-07-15: 40 meq via ORAL
  Filled 2017-07-15: qty 2

## 2017-07-15 MED ORDER — NICOTINE 21 MG/24HR TD PT24
21.0000 mg | MEDICATED_PATCH | Freq: Every day | TRANSDERMAL | Status: DC
  Administered 2017-07-15: 21 mg via TRANSDERMAL
  Filled 2017-07-15: qty 1

## 2017-07-15 MED ORDER — GABAPENTIN 300 MG PO CAPS
300.0000 mg | ORAL_CAPSULE | Freq: Three times a day (TID) | ORAL | Status: DC
  Administered 2017-07-15: 300 mg via ORAL
  Filled 2017-07-15: qty 1

## 2017-07-15 MED ORDER — HYDROXYZINE HCL 25 MG PO TABS: 50.0000 mg | ORAL_TABLET | Freq: Three times a day (TID) | ORAL | Status: DC

## 2017-07-15 NOTE — ED Triage Notes (Signed)
Pt brought in by [facility] under IVC.  Pt denies SI/HI, auditory or visual hallucinations.
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
## 2017-07-15 NOTE — ED Notes (Signed)
Bed: [facility]PT3 Expected date:  Expected time:  Means of arrival:  Comments: 

## 2017-07-15 NOTE — BH Assessment (Signed)
[facility] Assessment Progress Note  Per Buford Dresser, DO, this pt does not require psychiatric hospitalization at this time.  Pt presents under IVC initiated by pt's father, which Dr Mariea Clonts has rescinded.  Pt is to be discharged from [facility] with recommendation to continue treatment with Charlcie Cradle, MD at the [facility] at [facility]; his next appointment is scheduled for Thursday 07/17/2017 at 15:00.  This has been included in pt's discharge instructions.  Pt's nurse, [name], has been notified.  Jalene Mullet, [place] Triage Specialist [phone]
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
## 2017-07-15 NOTE — ED Notes (Signed)
Bed: WBH42 Expected date:  Expected time:  Means of arrival:  Comments: 

## 2017-07-15 NOTE — ED Notes (Signed)
Patient admitted on unit. Denies pain, SI/HI, AH/VH at this time.
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
## 2017-07-15 NOTE — ED Notes (Signed)
Patient called father for ride home.

## 2017-07-15 NOTE — ED Notes (Signed)
Pt stated "I was talking too loud on the phone in [facility] but I was really in their [employer]."  Pt denies being destructive @ the home.

## 2017-07-15 NOTE — Discharge Instructions (Signed)
For your behavioral health needs, you are advised to continue treatment with Charlcie Cradle, MD at the [facility] at [place].  You next appointment is scheduled for Thursday, July 17, 2017 at 3:00 pm:       Charlcie Cradle, MD      [facility] at [facility] [address] [place], [place] [address]      [phone]

## 2017-07-17 ENCOUNTER — Ambulatory Visit (INDEPENDENT_AMBULATORY_CARE_PROVIDER_SITE_OTHER): Payer: BLUE CROSS/BLUE SHIELD | Admitting: Psychiatry

## 2017-07-17 ENCOUNTER — Encounter (HOSPITAL_COMMUNITY): Payer: Self-pay | Admitting: Psychiatry

## 2017-07-17 DIAGNOSIS — Z560 Unemployment, unspecified: Secondary | ICD-10-CM | POA: Diagnosis not present

## 2017-07-17 DIAGNOSIS — F319 Bipolar disorder, unspecified: Secondary | ICD-10-CM | POA: Diagnosis not present

## 2017-07-17 DIAGNOSIS — F17210 Nicotine dependence, cigarettes, uncomplicated: Secondary | ICD-10-CM

## 2017-07-17 DIAGNOSIS — F99 Mental disorder, not otherwise specified: Secondary | ICD-10-CM

## 2017-07-17 DIAGNOSIS — F4310 Post-traumatic stress disorder, unspecified: Secondary | ICD-10-CM | POA: Diagnosis not present

## 2017-07-17 DIAGNOSIS — F411 Generalized anxiety disorder: Secondary | ICD-10-CM | POA: Diagnosis not present

## 2017-07-17 DIAGNOSIS — F5105 Insomnia due to other mental disorder: Secondary | ICD-10-CM | POA: Diagnosis not present

## 2017-07-17 MED ORDER — HYDROXYZINE HCL 50 MG PO TABS: 50.0000 mg | ORAL_TABLET | Freq: Four times a day (QID) | ORAL | 0 refills | Status: DC | PRN

## 2017-07-17 MED ORDER — ARIPIPRAZOLE 20 MG PO TABS: 20.0000 mg | ORAL_TABLET | Freq: Every day | ORAL | 0 refills | Status: DC

## 2017-07-17 MED ORDER — TRAZODONE HCL 100 MG PO TABS: 100.0000 mg | ORAL_TABLET | Freq: Every evening | ORAL | 0 refills | Status: DC | PRN

## 2017-07-17 MED ORDER — GABAPENTIN 300 MG PO CAPS: 300.0000 mg | ORAL_CAPSULE | Freq: Three times a day (TID) | ORAL | 0 refills | Status: DC
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
## 2017-08-02 ENCOUNTER — Other Ambulatory Visit (HOSPITAL_COMMUNITY): Payer: Self-pay | Admitting: Psychiatry

## 2017-08-07 ENCOUNTER — Telehealth (HOSPITAL_COMMUNITY): Payer: Self-pay

## 2017-08-07 ENCOUNTER — Other Ambulatory Visit (HOSPITAL_COMMUNITY): Payer: Self-pay | Admitting: Psychiatry

## 2017-08-07 DIAGNOSIS — F99 Mental disorder, not otherwise specified: Secondary | ICD-10-CM

## 2017-08-07 DIAGNOSIS — F5105 Insomnia due to other mental disorder: Secondary | ICD-10-CM

## 2017-08-07 DIAGNOSIS — F411 Generalized anxiety disorder: Secondary | ICD-10-CM

## 2017-08-07 DIAGNOSIS — F4310 Post-traumatic stress disorder, unspecified: Secondary | ICD-10-CM

## 2017-08-07 DIAGNOSIS — F319 Bipolar disorder, unspecified: Secondary | ICD-10-CM

## 2017-08-07 MED ORDER — GABAPENTIN 300 MG PO CAPS: 300.0000 mg | ORAL_CAPSULE | Freq: Three times a day (TID) | ORAL | 0 refills | Status: DC

## 2017-08-07 MED ORDER — TRAZODONE HCL 100 MG PO TABS: 100.0000 mg | ORAL_TABLET | Freq: Every evening | ORAL | 0 refills | Status: DC | PRN

## 2017-08-07 MED ORDER — ARIPIPRAZOLE 20 MG PO TABS: 20.0000 mg | ORAL_TABLET | Freq: Every day | ORAL | 0 refills | Status: DC

## 2017-08-07 MED ORDER — HYDROXYZINE HCL 50 MG PO TABS: 50.0000 mg | ORAL_TABLET | Freq: Four times a day (QID) | ORAL | 0 refills | Status: DC | PRN

## 2017-08-07 NOTE — Telephone Encounter (Signed)
Patient is calling to see if you will write him a letter for his disability case. He is trying to get SSD. Please review and advise, thank you

## 2017-08-18 ENCOUNTER — Telehealth (HOSPITAL_COMMUNITY): Payer: Self-pay

## 2017-08-18 NOTE — Telephone Encounter (Signed)
Patient is calling for a refill on Adderall, he is out as of today. 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 [facility]

## 2017-08-19 ENCOUNTER — Other Ambulatory Visit (HOSPITAL_COMMUNITY): Payer: Self-pay | Admitting: Psychiatry

## 2017-08-19 DIAGNOSIS — F902 Attention-deficit hyperactivity disorder, combined type: Secondary | ICD-10-CM

## 2017-08-19 MED ORDER — AMPHETAMINE-DEXTROAMPHET ER 20 MG PO CP24: 20.0000 mg | ORAL_CAPSULE | Freq: Two times a day (BID) | ORAL | 0 refills | Status: DC

## 2017-08-19 NOTE — Telephone Encounter (Signed)
All done, sent

## 2017-08-21 ENCOUNTER — Other Ambulatory Visit: Payer: Self-pay | Admitting: General Surgery

## 2017-08-21 DIAGNOSIS — S31139D Puncture wound of abdominal wall without foreign body, unspecified quadrant without penetration into peritoneal cavity, subsequent encounter: Principal | ICD-10-CM

## 2017-08-21 DIAGNOSIS — W3400XD Accidental discharge from unspecified firearms or gun, subsequent encounter: Secondary | ICD-10-CM

## 2017-08-25 ENCOUNTER — Other Ambulatory Visit: Payer: Self-pay

## 2017-09-06 ENCOUNTER — Encounter (HOSPITAL_COMMUNITY): Payer: Self-pay

## 2017-09-06 ENCOUNTER — Encounter (HOSPITAL_COMMUNITY): Payer: Self-pay | Admitting: *Deleted

## 2017-09-06 ENCOUNTER — Emergency Department (HOSPITAL_COMMUNITY)
Admission: EM | Admit: 2017-09-06 | Discharge: 2017-09-06 | Disposition: A | Discharge status: Other Acute Inpatient Hospital | Payer: BLUE CROSS/BLUE SHIELD | Attending: Emergency Medicine | Admitting: Emergency Medicine

## 2017-09-06 ENCOUNTER — Other Ambulatory Visit: Payer: Self-pay

## 2017-09-06 ENCOUNTER — Inpatient Hospital Stay (HOSPITAL_COMMUNITY)
Admission: AD | Admit: 2017-09-06 | Discharge: 2017-09-09 | DRG: 885 | Disposition: A | Discharge status: Home / Self Care | Payer: BLUE CROSS/BLUE SHIELD | Attending: Psychiatry | Admitting: Psychiatry

## 2017-09-06 DIAGNOSIS — Z79899 Other long term (current) drug therapy: Secondary | ICD-10-CM | POA: Insufficient documentation

## 2017-09-06 DIAGNOSIS — F411 Generalized anxiety disorder: Secondary | ICD-10-CM | POA: Diagnosis present

## 2017-09-06 DIAGNOSIS — F99 Mental disorder, not otherwise specified: Secondary | ICD-10-CM

## 2017-09-06 DIAGNOSIS — Z046 Encounter for general psychiatric examination, requested by authority: Secondary | ICD-10-CM | POA: Diagnosis not present

## 2017-09-06 DIAGNOSIS — Z87892 Personal history of anaphylaxis: Secondary | ICD-10-CM | POA: Diagnosis not present

## 2017-09-06 DIAGNOSIS — Z91018 Allergy to other foods: Secondary | ICD-10-CM

## 2017-09-06 DIAGNOSIS — F17210 Nicotine dependence, cigarettes, uncomplicated: Secondary | ICD-10-CM | POA: Diagnosis not present

## 2017-09-06 DIAGNOSIS — G4700 Insomnia, unspecified: Secondary | ICD-10-CM | POA: Diagnosis present

## 2017-09-06 DIAGNOSIS — F329 Major depressive disorder, single episode, unspecified: Secondary | ICD-10-CM | POA: Diagnosis not present

## 2017-09-06 DIAGNOSIS — F909 Attention-deficit hyperactivity disorder, unspecified type: Secondary | ICD-10-CM | POA: Insufficient documentation

## 2017-09-06 DIAGNOSIS — F5105 Insomnia due to other mental disorder: Secondary | ICD-10-CM

## 2017-09-06 DIAGNOSIS — R450 Nervousness: Secondary | ICD-10-CM | POA: Diagnosis not present

## 2017-09-06 DIAGNOSIS — F1290 Cannabis use, unspecified, uncomplicated: Secondary | ICD-10-CM | POA: Diagnosis present

## 2017-09-06 DIAGNOSIS — F332 Major depressive disorder, recurrent severe without psychotic features: Secondary | ICD-10-CM | POA: Insufficient documentation

## 2017-09-06 DIAGNOSIS — Z62810 Personal history of physical and sexual abuse in childhood: Secondary | ICD-10-CM | POA: Diagnosis not present

## 2017-09-06 DIAGNOSIS — F314 Bipolar disorder, current episode depressed, severe, without psychotic features: Secondary | ICD-10-CM | POA: Diagnosis not present

## 2017-09-06 DIAGNOSIS — F4310 Post-traumatic stress disorder, unspecified: Secondary | ICD-10-CM | POA: Diagnosis present

## 2017-09-06 DIAGNOSIS — R45850 Homicidal ideations: Secondary | ICD-10-CM | POA: Diagnosis not present

## 2017-09-06 DIAGNOSIS — F419 Anxiety disorder, unspecified: Secondary | ICD-10-CM | POA: Diagnosis not present

## 2017-09-06 DIAGNOSIS — F17213 Nicotine dependence, cigarettes, with withdrawal: Secondary | ICD-10-CM | POA: Diagnosis not present

## 2017-09-06 DIAGNOSIS — F17200 Nicotine dependence, unspecified, uncomplicated: Secondary | ICD-10-CM

## 2017-09-06 DIAGNOSIS — Z560 Unemployment, unspecified: Secondary | ICD-10-CM | POA: Diagnosis not present

## 2017-09-06 DIAGNOSIS — Z62820 Parent-biological child conflict: Secondary | ICD-10-CM | POA: Diagnosis not present

## 2017-09-06 LAB — CBC WITH DIFFERENTIAL/PLATELET
BASOS ABS: 0 10*3/uL (ref 0.0–0.1)
Basophils Relative: 0 %
EOS ABS: 0.2 10*3/uL (ref 0.0–0.7)
EOS PCT: 2 %
HCT: 44.6 % (ref 39.0–52.0)
Hemoglobin: 15 g/dL (ref 13.0–17.0)
Lymphocytes Relative: 44 %
Lymphs Abs: 3.8 10*3/uL (ref 0.7–4.0)
MCH: 29.8 pg (ref 26.0–34.0)
MCHC: 33.6 g/dL (ref 30.0–36.0)
MCV: 88.7 fL (ref 78.0–100.0)
MONO ABS: 0.5 10*3/uL (ref 0.1–1.0)
Monocytes Relative: 5 %
Neutro Abs: 4.3 10*3/uL (ref 1.7–7.7)
Neutrophils Relative %: 49 %
PLATELETS: 324 10*3/uL (ref 150–400)
RBC: 5.03 MIL/uL (ref 4.22–5.81)
RDW: 13.6 % (ref 11.5–15.5)
WBC: 8.7 10*3/uL (ref 4.0–10.5)

## 2017-09-06 LAB — COMPREHENSIVE METABOLIC PANEL
ALBUMIN: 4.1 g/dL (ref 3.5–5.0)
ALT: 13 U/L — ABNORMAL LOW (ref 17–63)
AST: 17 U/L (ref 15–41)
Alkaline Phosphatase: 73 U/L (ref 38–126)
Anion gap: 11 (ref 5–15)
CHLORIDE: 108 mmol/L (ref 101–111)
CO2: 25 mmol/L (ref 22–32)
Calcium: 9.2 mg/dL (ref 8.9–10.3)
Creatinine, Ser: 0.77 mg/dL (ref 0.61–1.24)
GFR calc Af Amer: >60 mL/min (ref >60)
GFR calc non Af Amer: >60 mL/min (ref >60)
GLUCOSE: 94 mg/dL (ref 65–99)
POTASSIUM: 3.6 mmol/L (ref 3.5–5.1)
Sodium: 144 mmol/L (ref 135–145)
Total Bilirubin: 0.6 mg/dL (ref 0.3–1.2)
Total Protein: 7.6 g/dL (ref 6.5–8.1)

## 2017-09-06 LAB — RAPID URINE DRUG SCREEN, HOSP PERFORMED
AMPHETAMINES: NOT DETECTED
Barbiturates: NOT DETECTED
Benzodiazepines: NOT DETECTED
Cocaine: NOT DETECTED
OPIATES: NOT DETECTED
TETRAHYDROCANNABINOL: POSITIVE — AB

## 2017-09-06 LAB — ETHANOL: ALCOHOL ETHYL (B): 42 mg/dL — AB (ref <10)

## 2017-09-06 MED ORDER — ONDANSETRON HCL 4 MG PO TABS: 4.0000 mg | ORAL_TABLET | Freq: Three times a day (TID) | ORAL | Status: DC | PRN

## 2017-09-06 MED ORDER — TRAZODONE HCL 50 MG PO TABS: 50.0000 mg | ORAL_TABLET | Freq: Every evening | ORAL | Status: DC | PRN

## 2017-09-06 MED ORDER — NICOTINE 21 MG/24HR TD PT24
21.0000 mg | MEDICATED_PATCH | Freq: Every day | TRANSDERMAL | Status: DC
Start: 2017-09-07 — End: 2017-09-09
  Administered 2017-09-07 – 2017-09-09 (×3): 21 mg via TRANSDERMAL
  Filled 2017-09-06 (×5): qty 1

## 2017-09-06 MED ORDER — HYDROXYZINE HCL 25 MG PO TABS: 25.0000 mg | ORAL_TABLET | Freq: Three times a day (TID) | ORAL | Status: DC | PRN

## 2017-09-06 MED ORDER — LORAZEPAM 1 MG PO TABS
1.0000 mg | ORAL_TABLET | Freq: Once | ORAL | Status: AC
  Administered 2017-09-06: 1 mg via ORAL
  Filled 2017-09-06: qty 1

## 2017-09-06 MED ORDER — HYDROXYZINE HCL 50 MG PO TABS
50.0000 mg | ORAL_TABLET | Freq: Four times a day (QID) | ORAL | Status: DC | PRN
  Administered 2017-09-06 – 2017-09-08 (×4): 50 mg via ORAL
  Filled 2017-09-06 (×4): qty 1
  Filled 2017-09-06: qty 10

## 2017-09-06 MED ORDER — AMPHETAMINE-DEXTROAMPHET ER 10 MG PO CP24
20.0000 mg | ORAL_CAPSULE | Freq: Two times a day (BID) | ORAL | Status: DC
  Administered 2017-09-06: 20 mg via ORAL
  Filled 2017-09-06: qty 2

## 2017-09-06 MED ORDER — GABAPENTIN 300 MG PO CAPS
300.0000 mg | ORAL_CAPSULE | Freq: Three times a day (TID) | ORAL | Status: DC
  Administered 2017-09-07 – 2017-09-09 (×8): 300 mg via ORAL
  Filled 2017-09-06: qty 1
  Filled 2017-09-06: qty 21
  Filled 2017-09-06: qty 1
  Filled 2017-09-06 (×3): qty 21
  Filled 2017-09-06 (×7): qty 1
  Filled 2017-09-06: qty 21
  Filled 2017-09-06: qty 1

## 2017-09-06 MED ORDER — ARIPIPRAZOLE 10 MG PO TABS
20.0000 mg | ORAL_TABLET | Freq: Every day | ORAL | Status: DC
  Administered 2017-09-06: 20 mg via ORAL
  Filled 2017-09-06: qty 2
  Filled 2017-09-06: qty 4

## 2017-09-06 MED ORDER — MAGNESIUM HYDROXIDE 400 MG/5ML PO SUSP: 30.0000 mL | Freq: Every day | ORAL | Status: DC | PRN

## 2017-09-06 MED ORDER — LORAZEPAM 0.5 MG PO TABS: 0.5000 mg | ORAL_TABLET | Freq: Once | ORAL | Status: DC

## 2017-09-06 MED ORDER — HYDROXYZINE HCL 25 MG PO TABS: 50.0000 mg | ORAL_TABLET | Freq: Four times a day (QID) | ORAL | Status: DC | PRN

## 2017-09-06 MED ORDER — ALUM & MAG HYDROXIDE-SIMETH 200-200-20 MG/5ML PO SUSP: 30.0000 mL | Freq: Four times a day (QID) | ORAL | Status: DC | PRN

## 2017-09-06 MED ORDER — ACETAMINOPHEN 325 MG PO TABS: 650.0000 mg | ORAL_TABLET | ORAL | Status: DC | PRN

## 2017-09-06 MED ORDER — ALUM & MAG HYDROXIDE-SIMETH 200-200-20 MG/5ML PO SUSP: 30.0000 mL | ORAL | Status: DC | PRN

## 2017-09-06 MED ORDER — ARIPIPRAZOLE 10 MG PO TABS
20.0000 mg | ORAL_TABLET | Freq: Every day | ORAL | Status: DC
  Administered 2017-09-07 – 2017-09-09 (×3): 20 mg via ORAL
  Filled 2017-09-06: qty 2
  Filled 2017-09-06: qty 14
  Filled 2017-09-06 (×2): qty 2
  Filled 2017-09-06: qty 14
  Filled 2017-09-06: qty 2

## 2017-09-06 MED ORDER — TRAZODONE HCL 100 MG PO TABS
100.0000 mg | ORAL_TABLET | Freq: Every evening | ORAL | Status: DC | PRN
  Administered 2017-09-06 – 2017-09-08 (×3): 100 mg via ORAL
  Filled 2017-09-06 (×3): qty 1
  Filled 2017-09-06: qty 7

## 2017-09-06 MED ORDER — ZOLPIDEM TARTRATE 5 MG PO TABS: 5.0000 mg | ORAL_TABLET | Freq: Every evening | ORAL | Status: DC | PRN

## 2017-09-06 MED ORDER — ACETAMINOPHEN 325 MG PO TABS
650.0000 mg | ORAL_TABLET | Freq: Four times a day (QID) | ORAL | Status: DC | PRN
  Administered 2017-09-08: 650 mg via ORAL
  Filled 2017-09-06: qty 2

## 2017-09-06 MED ORDER — GABAPENTIN 300 MG PO CAPS
300.0000 mg | ORAL_CAPSULE | Freq: Three times a day (TID) | ORAL | Status: DC
  Administered 2017-09-06 (×2): 300 mg via ORAL
  Filled 2017-09-06 (×2): qty 1

## 2017-09-06 MED ORDER — TRAZODONE HCL 100 MG PO TABS: 100.0000 mg | ORAL_TABLET | Freq: Every evening | ORAL | Status: DC | PRN

## 2017-09-06 NOTE — Progress Notes (Signed)
Nathan Vincent is a 22 year old male pt admitted on involuntary basis. On admission, [name] reports that his mother got him committed here and reports that there is nothing wrong with him. He reports that he has been taking his medications as prescribed. He denies any SI or HI and reports that his mother is making things up. He denies making any threats against his mother. He reports that he does use marijuana but denies any other substance abuse issue. He reports that he lives with his father and reports that he will go back there after discharge. He was oriented to the unit and safety maintained.

## 2017-09-06 NOTE — ED Notes (Signed)
tts into see 

## 2017-09-06 NOTE — ED Notes (Addendum)
Patient is calm and cooperative. Patient is aware of admission to [facility] and has no questions. Patient denies SI/HI and A/V hallucinations. Patient with Q 15 minute checks in progress and remains safe on the unit. Monitoring continues.

## 2017-09-06 NOTE — ED Notes (Signed)
Patient discharged to [facility]. Report called to [facility]. Patient alert and oriented. Patient denies pain. Patient also ambulatory at transport. Patient denies pain. Patient aware of admission and without questions at this time. All patient belongings returned to him and given to transporting officer. Patient transported by [facility] to [facility]. All paperwork given to transporting officer.

## 2017-09-06 NOTE — ED Triage Notes (Signed)
Pt BIB today by [employer]- Pt was IVC'd by mother. Per IVC papers, pt states HI thoughts towards mother. Third IVC this year by family.

## 2017-09-06 NOTE — ED Triage Notes (Signed)
Pt tearful in triage, but cooperative. Pt reports that he got in an argument with his mom, he told his mom that he was going to hurt her. Pt denies SI/HI, but per pt "I just say things when I'm upset."
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
## 2017-09-06 NOTE — ED Notes (Signed)
Bed: WLPT4 Expected date:  Expected time:  Means of arrival:  Comments: 

## 2017-09-06 NOTE — BH Assessment (Signed)
[facility] Assessment Progress Note Per [facility] patient has been accepted to [facility] 500-1 at 2100 hours.

## 2017-09-06 NOTE — ED Notes (Signed)
Pt ambulatory w/o difficulty frin triage to room 43

## 2017-09-06 NOTE — BH Assessment (Signed)
[facility] Assessment Progress Note  Case was staffed with Lord DNP who recommended a inpatient admission as appropriate bed placement is investigated.         

## 2017-09-06 NOTE — ED Notes (Signed)
Belongings--pr. Black socks, blk/white tee shirt, gray sweat pants. Locked in locker #43

## 2017-09-06 NOTE — Progress Notes (Signed)
IVC paperwork faxed to [facility] for review.  Lind Covert, MSW, LCSW Therapeutic Triage Specialist  [phone]
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
## 2017-09-06 NOTE — ED Notes (Signed)
Bed: [facility] Expected date:  Expected time:  Means of arrival:  Comments: Triage 4

## 2017-09-06 NOTE — Tx Team (Signed)
Initial Treatment Plan 09/06/2017 11:52 PM WARDEN BUFFA [id number]    PATIENT STRESSORS: Marital or family conflict Substance abuse   PATIENT STRENGTHS: Ability for insight Average or above average intelligence Capable of independent living Communication skills General fund of knowledge   PATIENT IDENTIFIED PROBLEMS: Depression SI/HI thoughts "Nothing wrong with me, my mom sent me here"                     DISCHARGE CRITERIA:  Ability to meet basic life and health needs Improved stabilization in mood, thinking, and/or behavior Verbal commitment to aftercare and medication compliance  PRELIMINARY DISCHARGE PLAN: Attend aftercare/continuing care group Return to previous living arrangement  PATIENT/FAMILY INVOLVEMENT: This treatment plan has been presented to and reviewed with the patient, Nathan Vincent, and/or family member, .  The patient and family have been given the opportunity to ask questions and make suggestions.  Duncan Alejandro, [place], [place] 09/06/2017, 11:52 PM

## 2017-09-07 DIAGNOSIS — F332 Major depressive disorder, recurrent severe without psychotic features: Principal | ICD-10-CM

## 2017-09-07 DIAGNOSIS — F17210 Nicotine dependence, cigarettes, uncomplicated: Secondary | ICD-10-CM

## 2017-09-07 DIAGNOSIS — G4700 Insomnia, unspecified: Secondary | ICD-10-CM

## 2017-09-07 DIAGNOSIS — R450 Nervousness: Secondary | ICD-10-CM

## 2017-09-07 DIAGNOSIS — F1290 Cannabis use, unspecified, uncomplicated: Secondary | ICD-10-CM

## 2017-09-07 DIAGNOSIS — F419 Anxiety disorder, unspecified: Secondary | ICD-10-CM

## 2017-09-07 DIAGNOSIS — Z62810 Personal history of physical and sexual abuse in childhood: Secondary | ICD-10-CM

## 2017-09-07 MED ORDER — BUSPIRONE HCL 5 MG PO TABS
5.0000 mg | ORAL_TABLET | Freq: Two times a day (BID) | ORAL | Status: DC
  Administered 2017-09-07 – 2017-09-09 (×5): 5 mg via ORAL
  Filled 2017-09-07 (×2): qty 1
  Filled 2017-09-07 (×2): qty 14
  Filled 2017-09-07: qty 1
  Filled 2017-09-07: qty 14
  Filled 2017-09-07: qty 1
  Filled 2017-09-07: qty 14
  Filled 2017-09-07 (×3): qty 1
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
## 2017-09-07 NOTE — BHH Group Notes (Signed)
[place] Group Notes:  (Nursing/MHT/Case Management/Adjunct)  Date:  09/07/2017  Time:  10:08 AM  Type of Therapy:  Goals/Orientation Group.  Participation Level:  Active  Participation Quality:  Appropriate  Affect:  Appropriate  Cognitive:  Appropriate  Insight:  Appropriate  Engagement in Group:  Engaged  Modes of Intervention:  Discussion  Summary of Progress/Problems: Pt attended goals/orientation group, pt was receptive.   Nathan Vincent 09/07/2017, 10:08 AM

## 2017-09-07 NOTE — BHH Group Notes (Addendum)
[facility] LCSW Group Therapy Note  Date/Time:  09/07/2017 11AM-12PM  Type of Therapy and Topic:  Group Therapy:  Healthy and Unhealthy Supports  Participation Level:  Active   Description of Group:  Patients in this group were introduced to the idea of adding a variety of healthy supports to address the various needs in their lives.Patients discussed what additional healthy supports could be helpful in their recovery and wellness after discharge in order to prevent future hospitalizations.   An emphasis was placed on using counselor, doctor, therapy groups, 12-step groups, and problem-specific support groups to expand supports.  They also worked as a group on developing a specific plan for several patients to deal with unhealthy supports through [place], psychoeducation with loved ones, and even termination of relationships.   Therapeutic Goals:   1)  discuss importance of adding supports to stay well once out of the hospital  2)  compare healthy versus unhealthy supports and identify some examples of each  3)  generate ideas and descriptions of healthy supports that can be added  4)  offer mutual support about how to address unhealthy supports  5)  encourage active participation in and adherence to discharge plan    Summary of Patient Progress:  The patient stated that current healthy supports in his life are his father girlfriend and his cats while current unhealthy supports include his mother.  The patient expressed a need to maintain boundaries from his mother and don't lost control when she crosses them.   Therapeutic Modalities:   Motivational Interviewing Brief Solution-Focused Therapy  Darin Engels, LCSWA

## 2017-09-07 NOTE — Progress Notes (Signed)
Patient ID: Nathan Vincent, male   DOB: 08-26-1995, 22 y.o.   MRN: [id number]    Per doctors orders pt signed in voluntary. Pt also signed 72 hour request for discharge, Dr. Parke Poisson and Ludger Nutting NP was made aware. Benancio Deeds RN
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
## 2017-09-07 NOTE — Plan of Care (Signed)
D: Pt denies SI/HI/AVH. Pt is pleasant and cooperative. Pt stated he was doing much better. Pt has been appropriate on the unit this evening.   A: Pt was offered support and encouragement. Pt was given scheduled medications. Pt was encourage to attend groups. Q 15 minute checks were done for safety.   R:Pt attends groups and interacts well with peers and staff. Pt is taking medication. Pt has no complaints.Pt receptive to treatment and safety maintained on unit.   Problem: Education: Goal: Emotional status will improve Outcome: Progressing   Problem: Education: Goal: Mental status will improve Outcome: Progressing   Problem: Activity: Goal: Interest or engagement in activities will improve Outcome: Progressing   Problem: Activity: Goal: Sleeping patterns will improve Outcome: Progressing   Problem: Coping: Goal: Ability to demonstrate self-control will improve Outcome: Progressing

## 2017-09-07 NOTE — BHH Group Notes (Signed)
[facility] Group Notes:  (Nursing/MHT/Case Management/Adjunct)  Date:  09/07/2017  Time:  10:10 AM  Type of Therapy:  Psychoeducational Skills  Participation Level:  Active  Participation Quality:  Appropriate  Affect:  Appropriate  Cognitive:  Appropriate  Insight:  Appropriate  Engagement in Group:  Engaged  Modes of Intervention:  Problem-solving  Summary of Progress/Problems: Pt attended Psychoeducational group with top topic healthy support systems.   Nathan Vincent [name] 09/07/2017, 10:10 AM

## 2017-09-08 DIAGNOSIS — Z62820 Parent-biological child conflict: Secondary | ICD-10-CM

## 2017-09-08 DIAGNOSIS — Z560 Unemployment, unspecified: Secondary | ICD-10-CM

## 2017-09-08 LAB — LIPID PANEL
Cholesterol: 162 mg/dL (ref 0–200)
HDL: 64 mg/dL (ref >40)
LDL Cholesterol: 71 mg/dL (ref 0–99)
Total CHOL/HDL Ratio: 2.5 RATIO
Triglycerides: 135 mg/dL (ref <150)
VLDL: 27 mg/dL (ref 0–40)

## 2017-09-08 LAB — HEMOGLOBIN A1C
Hgb A1c MFr Bld: 5 % (ref 4.8–5.6)
MEAN PLASMA GLUCOSE: 96.8 mg/dL

## 2017-09-08 LAB — TSH: TSH: 2.201 u[IU]/mL (ref 0.350–4.500)

## 2017-09-08 MED ORDER — HYDROXYZINE HCL 50 MG PO TABS
50.0000 mg | ORAL_TABLET | Freq: Once | ORAL | Status: AC
  Administered 2017-09-08: 50 mg via ORAL
  Filled 2017-09-08: qty 1

## 2017-09-08 NOTE — Progress Notes (Signed)
Adult Psychoeducational Group Note  Date:  09/08/2017 Time:  9:09 PM  Group Topic/Focus:  Wrap-Up Group:   The focus of this group is to help patients review their daily goal of treatment and discuss progress on daily workbooks.  Participation Level:  Active  Participation Quality:  Appropriate  Affect:  Appropriate  Cognitive:  Appropriate  Insight: Appropriate  Engagement in Group:  Engaged  Modes of Intervention:  Discussion  Additional Comments:  The patient expressed that he rates today a 8.The patient also said that he attend group and his goal was to stay positive.  Nash Shearer 09/08/2017, 9:09 PM
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
## 2017-09-08 NOTE — Tx Team (Signed)
Interdisciplinary Treatment and Diagnostic Plan Update  09/08/2017 Time of Session: 10:18 AM  Nathan Vincent MRN: [id number]  Principal Diagnosis: Severe recurrent major depression without psychotic features [facility])  Secondary Diagnoses: Principal Problem:   Severe recurrent major depression without psychotic features ([place])   Current Medications:  Current Facility-Administered Medications  Medication Dose Route Frequency Provider Last Rate Last Dose  . acetaminophen (TYLENOL) tablet 650 mg  650 mg Oral Q6H PRN Lindon Romp A, NP      . alum & mag hydroxide-simeth (MAALOX/MYLANTA) 200-200-20 MG/5ML suspension 30 mL  30 mL Oral Q4H PRN Lindon Romp A, NP      . ARIPiprazole (ABILIFY) tablet 20 mg  20 mg Oral Daily Lindon Romp A, NP   20 mg at 09/08/17 0817  . busPIRone (BUSPAR) tablet 5 mg  5 mg Oral BID Derrill Center, NP   5 mg at 09/08/17 0817  . gabapentin (NEURONTIN) capsule 300 mg  300 mg Oral TID Lindon Romp A, NP   300 mg at 09/08/17 0817  . hydrOXYzine (ATARAX/VISTARIL) tablet 50 mg  50 mg Oral Q6H PRN Rozetta Nunnery, NP   50 mg at 09/07/17 2157  . magnesium hydroxide (MILK OF MAGNESIA) suspension 30 mL  30 mL Oral Daily PRN Lindon Romp A, NP      . nicotine (NICODERM CQ - dosed in mg/24 hours) patch 21 mg  21 mg Transdermal Daily Lindon Romp A, NP   21 mg at 09/08/17 0819  . traZODone (DESYREL) tablet 100 mg  100 mg Oral QHS PRN Lindon Romp A, NP   100 mg at 09/07/17 2157    PTA Medications: Medications Prior to Admission  Medication Sig Dispense Refill Last Dose  . acetaminophen (TYLENOL) 500 MG tablet Take 1 tablet (500 mg total) by mouth every 6 (six) hours as needed for moderate pain. (Patient taking differently: Take 500-1,000 mg by mouth every 6 (six) hours as needed for moderate pain. ) 1 tablet 0 Past Month at Unknown time  . amphetamine-dextroamphetamine (ADDERALL XR) 20 MG 24 hr capsule Take 1 capsule (20 mg total) by mouth 2 (two) times daily. 60 capsule 0  09/05/2017 at Unknown time  . ARIPiprazole (ABILIFY) 20 MG tablet Take 1 tablet (20 mg total) by mouth daily. For mood control 30 tablet 0 09/05/2017 at Unknown time  . gabapentin (NEURONTIN) 300 MG capsule Take 1 capsule (300 mg total) by mouth 3 (three) times daily. For agitation 90 capsule 0 09/05/2017 at Unknown time  . hydrOXYzine (ATARAX/VISTARIL) 50 MG tablet Take 1 tablet (50 mg total) by mouth every 6 (six) hours as needed for anxiety. 75 tablet 0 09/05/2017 at Unknown time  . nicotine (NICODERM CQ - DOSED IN MG/24 HOURS) 21 mg/24hr patch Place 1 patch (21 mg total) onto the skin daily. (May purchase from over the counter at the pharmacy): For smoking cessation (Patient not taking: Reported on 07/17/2017) 28 patch 0 Not Taking at Unknown time  . ondansetron (ZOFRAN) 4 MG tablet Take 1 tablet (4 mg total) by mouth every 6 (six) hours as needed for nausea or vomiting. 12 tablet 0 Past Month at Unknown time  . traZODone (DESYREL) 100 MG tablet Take 1 tablet (100 mg total) by mouth at bedtime as needed for sleep. 30 tablet 0 09/05/2017 at Unknown time    Patient Stressors: Marital or family conflict Substance abuse  Patient Strengths: Ability for insight Average or above average intelligence Capable of independent living Curator fund  of knowledge  Treatment Modalities: Medication Management, Group therapy, Case management,  1 to 1 session with clinician, Psychoeducation, Recreational therapy.   Physician Treatment Plan for Primary Diagnosis: Severe recurrent major depression without psychotic features ([place]) Long Term Goal(s): Improvement in symptoms so as ready for discharge  Short Term Goals: Ability to identify changes in lifestyle to reduce recurrence of condition will improve Ability to verbalize feelings will improve Ability to demonstrate self-control will improve Compliance with prescribed medications will improve Ability to identify triggers associated with  substance abuse/mental health issues will improve Ability to identify changes in lifestyle to reduce recurrence of condition will improve Ability to disclose and discuss suicidal ideas Compliance with prescribed medications will improve  Medication Management: Evaluate patient's response, side effects, and tolerance of medication regimen.  Therapeutic Interventions: 1 to 1 sessions, Unit Group sessions and Medication administration.  Evaluation of Outcomes: Adequate for Discharge  Physician Treatment Plan for Secondary Diagnosis: Principal Problem:   Severe recurrent major depression without psychotic features ([place])   Long Term Goal(s): Improvement in symptoms so as ready for discharge  Short Term Goals: Ability to identify changes in lifestyle to reduce recurrence of condition will improve Ability to verbalize feelings will improve Ability to demonstrate self-control will improve Compliance with prescribed medications will improve Ability to identify triggers associated with substance abuse/mental health issues will improve Ability to identify changes in lifestyle to reduce recurrence of condition will improve Ability to disclose and discuss suicidal ideas Compliance with prescribed medications will improve  Medication Management: Evaluate patient's response, side effects, and tolerance of medication regimen.  Therapeutic Interventions: 1 to 1 sessions, Unit Group sessions and Medication administration.  Evaluation of Outcomes: Adequate for Discharge   RN Treatment Plan for Primary Diagnosis: Severe recurrent major depression without psychotic features ([place]) Long Term Goal(s): Knowledge of disease and therapeutic regimen to maintain health will improve  Short Term Goals: Ability to identify and develop effective coping behaviors will improve and Compliance with prescribed medications will improve  Medication Management: RN will administer medications as ordered by provider,  will assess and evaluate patient's response and provide education to patient for prescribed medication. RN will report any adverse and/or side effects to prescribing provider.  Therapeutic Interventions: 1 on 1 counseling sessions, Psychoeducation, Medication administration, Evaluate responses to treatment, Monitor vital signs and CBGs as ordered, Perform/monitor CIWA, COWS, AIMS and Fall Risk screenings as ordered, Perform wound care treatments as ordered.  Evaluation of Outcomes: Adequate for Discharge   LCSW Treatment Plan for Primary Diagnosis: Severe recurrent major depression without psychotic features ([place]) Long Term Goal(s): Safe transition to appropriate next level of care at discharge, Engage patient in therapeutic group addressing interpersonal concerns.  Short Term Goals: Engage patient in aftercare planning with referrals and resources  Therapeutic Interventions: Assess for all discharge needs, 1 to 1 time with Social worker, Explore available resources and support systems, Assess for adequacy in community support network, Educate family and significant other(s) on suicide prevention, Complete Psychosocial Assessment, Interpersonal group therapy.  Evaluation of Outcomes: Met  Return home, follow up at [facility] in Treatment: Attending groups: Yes Participating in groups: Yes Taking medication as prescribed: Yes Toleration medication: Yes, no side effects reported at this time Family/Significant other contact made: No Patient understands diagnosis: Yes AEB asking for help finding a therapist Discussing patient identified problems/goals with staff: Yes Medical problems stabilized or resolved: Yes Denies suicidal/homicidal ideation: Yes Issues/concerns per patient self-inventory: None Other: N/A  New problem(s) identified: None identified at this time.   New Short Term/Long Term Goal(s): "I want to get out of here ASAP. My mother put me here out of  spite.I haven't seen a therapist for awhile.  That would be a good idea."   Discharge Plan or Barriers:   Reason for Continuation of Hospitalization: Anxiety  Medication stabilization   Estimated Length of Stay: Likely d/c tomorrow, Wed at the latest as pt signed 72 hr request last night  Attendees: Patient: Nathan Vincent 09/08/2017  10:18 AM  Physician: Maris Berger, MD 09/08/2017  10:18 AM  Nursing: Sena Hitch, RN 09/08/2017  10:18 AM  RN Care Manager: Lars Pinks, RN 09/08/2017  10:18 AM  Social Worker: Ripley Fraise 09/08/2017  10:18 AM  Recreational Therapist: Winfield Cunas 09/08/2017  10:18 AM  Other: Norberto Sorenson 09/08/2017  10:18 AM  Other:  09/08/2017  10:18 AM    Scribe for Treatment Team:  Roque Lias LCSW 09/08/2017 10:18 AM

## 2017-09-08 NOTE — Progress Notes (Signed)
Recreation Therapy Notes    Date: 5.6.19 Time: 1000 Location: 500 [address] Dayroom  Group Topic: Anger Management  Goal Area(s) Addresses:  Patient will identify triggers for anger.  Patient will identify physical reaction to anger.   Patient will identify benefit of using coping skills when angry.  Behavioral Response: Engaged  Intervention: Worksheet  Activity: Intro to Anger Management.  Patients were given a worksheet in which they were to identify what causes their anger, their reaction to anger and any problems they have encountered because of anger.  Education: Anger Management, Discharge Planning   Education Outcome: Acknowledges education/In group clarification offered/Needs additional education.   Clinical Observations/Feedback: Pt stated anger is "a natural emotion".  Pt stated your reaction to anger usually causes the problem.  Pt stated when he has negative reactions to anger, it "gives me instant gratification but then feel bad afterwards".  Pt expressed some of the things that lead to his anger are his mom, debt collectors and not finding a job or getting into school.  Pt stated he reacts to anger by shouting, breaking things and pacing back and forth.  Pt explained the major problem he has run into because of anger was "it got me here".  One of his coping skills was deep breathing exercises.      Victorino Sparrow, LRT/CTRS        [name], Lorian Yaun A 09/08/2017 11:05 AM

## 2017-09-08 NOTE — Plan of Care (Signed)
  Problem: Education: Goal: Knowledge of [facility] Education information/materials will improve Outcome: Progressing   Problem: Activity: Goal: Interest or engagement in activities will improve Outcome: Progressing   Problem: Medication: Goal: Compliance with prescribed medication regimen will improve Outcome: Progressing   Problem: Coping: Goal: Level of anxiety will decrease Outcome: Progressing   Problem: Safety: Goal: Ability to remain free from injury will improve Outcome: Progressing D: Pt A & O X4.  Visible in his room on initial contact. Denies SI, HI and AVH. Report back pain 3/10 when assessed. Rates depression 3/10, anxiety 5/10 and hopelessness all 0/10 on self. Presents animated but anxious. Pt's goal for today is to "stay positive". Pt had an emesis this evening post dinner, consisted of undigested food; ginger ale given and was tolerated well. A: Introduced self to pt. Emotional support and availability provided to pt throughout this shift. Scheduled and PRN (Tylenol and Vistaril) medications given with verbal education and effects monitored. Safety checks maintained at Q 15 minutes intervals without outburst or self harm gestures.   R: Pt receptive to care and cooperative with unit routines. Compliant with medications when offered. Denies adverse drug reactions when assessed. Attended and participated in scheduled unit groups. POC continues for safety and mood stability.
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
## 2017-09-09 DIAGNOSIS — F314 Bipolar disorder, current episode depressed, severe, without psychotic features: Secondary | ICD-10-CM

## 2017-09-09 MED ORDER — ARIPIPRAZOLE 20 MG PO TABS: 20.0000 mg | ORAL_TABLET | Freq: Every day | ORAL | 0 refills | Status: DC

## 2017-09-09 MED ORDER — TRAZODONE HCL 100 MG PO TABS
100.0000 mg | ORAL_TABLET | Freq: Every evening | ORAL | 0 refills | Status: DC | PRN
Start: 2017-09-09 — End: 2017-09-18

## 2017-09-09 MED ORDER — NICOTINE 21 MG/24HR TD PT24: 21.0000 mg | MEDICATED_PATCH | Freq: Every day | TRANSDERMAL | 0 refills | Status: DC

## 2017-09-09 MED ORDER — BUSPIRONE HCL 5 MG PO TABS: 5.0000 mg | ORAL_TABLET | Freq: Two times a day (BID) | ORAL | 0 refills | Status: DC

## 2017-09-09 MED ORDER — GABAPENTIN 300 MG PO CAPS: 300.0000 mg | ORAL_CAPSULE | Freq: Three times a day (TID) | ORAL | 0 refills | Status: DC

## 2017-09-09 MED ORDER — HYDROXYZINE HCL 50 MG PO TABS: 50.0000 mg | ORAL_TABLET | Freq: Four times a day (QID) | ORAL | 0 refills | Status: DC | PRN

## 2017-09-09 NOTE — Progress Notes (Signed)
Pt discharged home with his father. Pt was ambulatory, stable and appreciative at that time. All papers, prescriptions and medication sample were given and valuables returned. Verbal understanding expressed. Denies SI/HI and A/VH. Pt given opportunity to express concerns and ask questions.

## 2017-09-09 NOTE — Progress Notes (Signed)
Recreation Therapy Notes  Date: 5.7.19 Time: 0945 Location: [address] Dayroom  Group Topic: Wellness  Goal Area(s) Addresses:  Patient will define components of whole wellness. Patient will verbalize benefit of whole wellness.  Behavioral Response: Engaged  Intervention: Music  Activity: Exercise.  LRT led group in four rounds of exercises.  LRT let each patient pick an exercise to lead the group in.  Education: Wellness, Dentist.   Education Outcome: Acknowledges education/In group clarification offered/Needs additional education.   Clinical Observations/Feedback:  Pt stated most people don't exercise because of "laziness".  Pt was active, social and pleasant during group.   Victorino Sparrow, LRT/CTRS    [name], Akram Kissick A 09/09/2017 10:36 AM
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
## 2017-09-09 NOTE — Plan of Care (Signed)
D: Pt denies SI/HI/AVH. Pt is pleasant and cooperative. Pt  stated he was ok, had good day  A: Pt was offered support and encouragement. Pt was given scheduled medications. Pt was encourage to attend groups. Q 15 minute checks were done for safety.  R:Pt attends groups and interacts well with peers and staff. Pt is taking medication. Pt has no complaints.Pt receptive to treatment and safety maintained on unit.   Problem: Coping: Goal: Ability to verbalize frustrations and anger appropriately will improve Outcome: Progressing   Problem: Safety: Goal: Periods of time without injury will increase Outcome: Progressing   Problem: Education: Goal: Knowledge of the prescribed therapeutic regimen will improve Outcome: Progressing
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
## 2017-09-09 NOTE — Progress Notes (Signed)
Recreation Therapy Notes  INPATIENT RECREATION TR PLAN  Patient Details Name: Nathan Vincent MRN: [id number] DOB: 1995/09/06 Today's Date: 09/09/2017  Rec Therapy Plan Is patient appropriate for Therapeutic Recreation?: Yes Treatment times per week: about 3 days Estimated Length of Stay: 5-7 days TR Treatment/Interventions: Group participation (Comment)  Discharge Criteria Pt will be discharged from therapy if:: Discharged Treatment plan/goals/alternatives discussed and agreed upon by:: Patient/family  Discharge Summary Short term goals set: See care plan Short term goals met: Complete Progress toward goals comments: Groups attended Which groups?: Wellness, Anger management Reason goals not met: None Therapeutic equipment acquired: N/A Reason patient discharged from therapy: Discharge from hospital Pt/family agrees with progress & goals achieved: Yes Date patient discharged from therapy: 09/09/17    Victorino Sparrow, LRT/CTRS  [name], [name] 09/09/2017, 11:17 AM

## 2017-09-09 NOTE — Progress Notes (Signed)
  [facility] Adult Case Management Discharge Plan :  Will you be returning to the same living situation after discharge:  Yes,  home At discharge, do you have transportation home?: Yes,  father Do you have the ability to pay for your medications: Yes,  insurance  Release of information consent forms completed and in the chart;  Patient's signature needed at discharge.  Patient to Follow up at: Follow-up Information    BEHAVIORAL HEALTH OUTPATIENT THERAPY [place] Follow up on 09/18/2017.   Specialty:  Behavioral Health Why:  Thursday at 2:30 with the Dr Minette Brine information: [place] [id number] [place] [phone]          Next level of care provider has access to [place] and Suicide Prevention discussed: Yes,  yes  Have you used any form of tobacco in the last 30 days? (Cigarettes, Smokeless Tobacco, Cigars, and/or Pipes): Yes  Has patient been referred to the Quitline?: Patient refused referral  Patient has been referred for addiction treatment: Pt. refused referral  Trish Mage, LCSW 09/09/2017, 9:01 AM

## 2017-09-09 NOTE — BHH Suicide Risk Assessment (Signed)
[place] INPATIENT:  Family/Significant Other Suicide Prevention Education  Suicide Prevention Education:  Education Completed; No one has been identified by the patient as the family member/significant other with whom the patient will be residing, and identified as the person(s) who will aid the patient in the event of a mental health crisis (suicidal ideations/suicide attempt).  With written consent from the patient, the family member/significant other has been provided the following suicide prevention education, prior to the and/or following the discharge of the patient.  The suicide prevention education provided includes the following:  Suicide risk factors  Suicide prevention and interventions  National Suicide Hotline telephone number  [facility] assessment telephone number  [facility] Emergency Assistance [place] and/or Residential Mobile Crisis Unit telephone number  Request made of family/significant other to:  Remove weapons (e.g., guns, rifles, knives), all items previously/currently identified as safety concern.    Remove drugs/medications (over-the-counter, prescriptions, illicit drugs), all items previously/currently identified as a safety concern.  The family member/significant other verbalizes understanding of the suicide prevention education information provided.  The family member/significant other agrees to remove the items of safety concern listed above. The patient did not endorse SI at the time of admission, nor did the patient c/o SI during the stay here.  SPE not required. However, I did talk to father, Nathan Vincent, [phone], and went over crises plan and treatment team recommendations. Trish Mage 09/09/2017, 8:59 AM

## 2017-09-18 ENCOUNTER — Ambulatory Visit (INDEPENDENT_AMBULATORY_CARE_PROVIDER_SITE_OTHER): Payer: BLUE CROSS/BLUE SHIELD | Admitting: Psychiatry

## 2017-09-18 ENCOUNTER — Encounter (HOSPITAL_COMMUNITY): Payer: Self-pay | Admitting: Psychiatry

## 2017-09-18 VITALS — BP 122/70 | HR 88 | Ht 72.0 in | Wt 136.0 lb

## 2017-09-18 DIAGNOSIS — F900 Attention-deficit hyperactivity disorder, predominantly inattentive type: Secondary | ICD-10-CM | POA: Diagnosis not present

## 2017-09-18 DIAGNOSIS — F4310 Post-traumatic stress disorder, unspecified: Secondary | ICD-10-CM | POA: Diagnosis not present

## 2017-09-18 DIAGNOSIS — F319 Bipolar disorder, unspecified: Secondary | ICD-10-CM | POA: Diagnosis not present

## 2017-09-18 DIAGNOSIS — F5105 Insomnia due to other mental disorder: Secondary | ICD-10-CM

## 2017-09-18 DIAGNOSIS — F17210 Nicotine dependence, cigarettes, uncomplicated: Secondary | ICD-10-CM

## 2017-09-18 DIAGNOSIS — F99 Mental disorder, not otherwise specified: Secondary | ICD-10-CM

## 2017-09-18 DIAGNOSIS — F411 Generalized anxiety disorder: Secondary | ICD-10-CM

## 2017-09-18 DIAGNOSIS — Z560 Unemployment, unspecified: Secondary | ICD-10-CM | POA: Diagnosis not present

## 2017-09-18 MED ORDER — AMPHETAMINE-DEXTROAMPHET ER 20 MG PO CP24: 20.0000 mg | ORAL_CAPSULE | Freq: Two times a day (BID) | ORAL | 0 refills | Status: DC

## 2017-09-18 MED ORDER — HYDROXYZINE HCL 50 MG PO TABS: 50.0000 mg | ORAL_TABLET | Freq: Three times a day (TID) | ORAL | 1 refills | Status: DC | PRN

## 2017-09-18 MED ORDER — TRAZODONE HCL 100 MG PO TABS: 100.0000 mg | ORAL_TABLET | Freq: Every evening | ORAL | 1 refills | Status: DC | PRN

## 2017-09-18 MED ORDER — BUSPIRONE HCL 10 MG PO TABS: 10.0000 mg | ORAL_TABLET | Freq: Two times a day (BID) | ORAL | 1 refills | Status: DC

## 2017-09-18 MED ORDER — ARIPIPRAZOLE 20 MG PO TABS: 20.0000 mg | ORAL_TABLET | Freq: Every day | ORAL | 1 refills | Status: DC

## 2017-09-18 MED ORDER — GABAPENTIN 300 MG PO CAPS: 300.0000 mg | ORAL_CAPSULE | Freq: Three times a day (TID) | ORAL | 1 refills | Status: DC
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
## 2017-10-09 ENCOUNTER — Encounter (HOSPITAL_COMMUNITY): Payer: Self-pay

## 2017-10-30 ENCOUNTER — Ambulatory Visit (INDEPENDENT_AMBULATORY_CARE_PROVIDER_SITE_OTHER): Payer: BLUE CROSS/BLUE SHIELD | Admitting: Psychiatry

## 2017-10-30 ENCOUNTER — Encounter (HOSPITAL_COMMUNITY): Payer: Self-pay | Admitting: Psychiatry

## 2017-10-30 VITALS — BP 122/74 | HR 88 | Ht 72.0 in | Wt 137.0 lb

## 2017-10-30 DIAGNOSIS — F99 Mental disorder, not otherwise specified: Secondary | ICD-10-CM | POA: Diagnosis not present

## 2017-10-30 DIAGNOSIS — F319 Bipolar disorder, unspecified: Secondary | ICD-10-CM

## 2017-10-30 DIAGNOSIS — F4310 Post-traumatic stress disorder, unspecified: Secondary | ICD-10-CM

## 2017-10-30 DIAGNOSIS — F411 Generalized anxiety disorder: Secondary | ICD-10-CM | POA: Diagnosis not present

## 2017-10-30 DIAGNOSIS — F900 Attention-deficit hyperactivity disorder, predominantly inattentive type: Secondary | ICD-10-CM

## 2017-10-30 DIAGNOSIS — F5105 Insomnia due to other mental disorder: Secondary | ICD-10-CM

## 2017-10-30 MED ORDER — HYDROXYZINE HCL 50 MG PO TABS: 50.0000 mg | ORAL_TABLET | Freq: Every day | ORAL | 0 refills | Status: DC | PRN

## 2017-10-30 MED ORDER — GABAPENTIN 300 MG PO CAPS
300.0000 mg | ORAL_CAPSULE | Freq: Three times a day (TID) | ORAL | 0 refills | Status: DC
Start: 2017-10-30 — End: 2017-12-25

## 2017-10-30 MED ORDER — AMPHETAMINE-DEXTROAMPHET ER 20 MG PO CP24
20.0000 mg | ORAL_CAPSULE | Freq: Two times a day (BID) | ORAL | 0 refills | Status: DC
Start: 2017-10-30 — End: 2017-12-19

## 2017-10-30 MED ORDER — BUSPIRONE HCL 15 MG PO TABS: 15.0000 mg | ORAL_TABLET | Freq: Two times a day (BID) | ORAL | 0 refills | Status: DC

## 2017-10-30 MED ORDER — TRAZODONE HCL 100 MG PO TABS: 100.0000 mg | ORAL_TABLET | Freq: Every evening | ORAL | 0 refills | Status: DC | PRN

## 2017-10-30 MED ORDER — ARIPIPRAZOLE 30 MG PO TABS: 30.0000 mg | ORAL_TABLET | Freq: Every day | ORAL | 0 refills | Status: DC
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
## 2017-12-18 ENCOUNTER — Telehealth (HOSPITAL_COMMUNITY): Payer: Self-pay

## 2017-12-18 NOTE — Telephone Encounter (Signed)
Patient needs a refill on Adderall XR 20mg  and Abilify 30 mg appt is scheduled for 12-25-17. Please advise

## 2017-12-19 ENCOUNTER — Other Ambulatory Visit (HOSPITAL_COMMUNITY): Payer: Self-pay

## 2017-12-19 DIAGNOSIS — F411 Generalized anxiety disorder: Secondary | ICD-10-CM

## 2017-12-19 DIAGNOSIS — F900 Attention-deficit hyperactivity disorder, predominantly inattentive type: Secondary | ICD-10-CM

## 2017-12-19 DIAGNOSIS — F319 Bipolar disorder, unspecified: Secondary | ICD-10-CM

## 2017-12-19 DIAGNOSIS — F4310 Post-traumatic stress disorder, unspecified: Secondary | ICD-10-CM

## 2017-12-19 MED ORDER — ARIPIPRAZOLE 30 MG PO TABS: 30.0000 mg | ORAL_TABLET | Freq: Every day | ORAL | 0 refills | Status: DC

## 2017-12-19 MED ORDER — AMPHETAMINE-DEXTROAMPHET ER 20 MG PO CP24: 20.0000 mg | ORAL_CAPSULE | Freq: Two times a day (BID) | ORAL | 0 refills | Status: DC

## 2017-12-23 ENCOUNTER — Other Ambulatory Visit (HOSPITAL_COMMUNITY): Payer: Self-pay | Admitting: Psychiatry

## 2017-12-23 DIAGNOSIS — F99 Mental disorder, not otherwise specified: Principal | ICD-10-CM

## 2017-12-23 DIAGNOSIS — F411 Generalized anxiety disorder: Secondary | ICD-10-CM

## 2017-12-23 DIAGNOSIS — F5105 Insomnia due to other mental disorder: Secondary | ICD-10-CM

## 2017-12-23 DIAGNOSIS — F4310 Post-traumatic stress disorder, unspecified: Secondary | ICD-10-CM

## 2017-12-24 NOTE — Telephone Encounter (Signed)
Refills per Dr. Doyne Keel was sent to the pharmacy, Adderall, and Abilify

## 2017-12-25 ENCOUNTER — Ambulatory Visit (INDEPENDENT_AMBULATORY_CARE_PROVIDER_SITE_OTHER): Payer: BLUE CROSS/BLUE SHIELD | Admitting: Psychiatry

## 2017-12-25 ENCOUNTER — Encounter (HOSPITAL_COMMUNITY): Payer: Self-pay | Admitting: Psychiatry

## 2017-12-25 VITALS — BP 120/70 | HR 74 | Ht 72.0 in | Wt 147.0 lb

## 2017-12-25 DIAGNOSIS — F900 Attention-deficit hyperactivity disorder, predominantly inattentive type: Secondary | ICD-10-CM | POA: Diagnosis not present

## 2017-12-25 DIAGNOSIS — F411 Generalized anxiety disorder: Secondary | ICD-10-CM

## 2017-12-25 DIAGNOSIS — F319 Bipolar disorder, unspecified: Secondary | ICD-10-CM | POA: Diagnosis not present

## 2017-12-25 DIAGNOSIS — F5105 Insomnia due to other mental disorder: Secondary | ICD-10-CM

## 2017-12-25 DIAGNOSIS — F4310 Post-traumatic stress disorder, unspecified: Secondary | ICD-10-CM

## 2017-12-25 DIAGNOSIS — F99 Mental disorder, not otherwise specified: Secondary | ICD-10-CM

## 2017-12-25 MED ORDER — ARIPIPRAZOLE ER 400 MG IM PRSY: 400.0000 mg | PREFILLED_SYRINGE | INTRAMUSCULAR | 3 refills | Status: DC

## 2017-12-25 MED ORDER — TRAZODONE HCL 100 MG PO TABS: 100.0000 mg | ORAL_TABLET | Freq: Every evening | ORAL | 0 refills | Status: DC | PRN

## 2017-12-25 MED ORDER — AMPHETAMINE-DEXTROAMPHET ER 20 MG PO CP24: 20.0000 mg | ORAL_CAPSULE | Freq: Two times a day (BID) | ORAL | 0 refills | Status: DC

## 2017-12-25 MED ORDER — ARIPIPRAZOLE 30 MG PO TABS: 30.0000 mg | ORAL_TABLET | Freq: Every day | ORAL | 0 refills | Status: DC

## 2017-12-25 MED ORDER — GABAPENTIN 300 MG PO CAPS: 300.0000 mg | ORAL_CAPSULE | Freq: Three times a day (TID) | ORAL | 0 refills | Status: DC

## 2017-12-25 MED ORDER — BUSPIRONE HCL 15 MG PO TABS: 15.0000 mg | ORAL_TABLET | Freq: Three times a day (TID) | ORAL | 0 refills | Status: DC
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
## 2018-01-01 ENCOUNTER — Ambulatory Visit (HOSPITAL_COMMUNITY): Payer: Self-pay

## 2018-01-07 DIAGNOSIS — S31139A Puncture wound of abdominal wall without foreign body, unspecified quadrant without penetration into peritoneal cavity, initial encounter: Secondary | ICD-10-CM | POA: Diagnosis not present

## 2018-01-07 DIAGNOSIS — W3400XA Accidental discharge from unspecified firearms or gun, initial encounter: Secondary | ICD-10-CM | POA: Diagnosis not present

## 2018-01-07 DIAGNOSIS — M549 Dorsalgia, unspecified: Secondary | ICD-10-CM | POA: Diagnosis not present

## 2018-01-16 ENCOUNTER — Other Ambulatory Visit: Payer: Self-pay | Admitting: Gastroenterology

## 2018-01-16 DIAGNOSIS — R633 Feeding difficulties: Secondary | ICD-10-CM | POA: Diagnosis not present

## 2018-01-16 DIAGNOSIS — R112 Nausea with vomiting, unspecified: Secondary | ICD-10-CM

## 2018-01-22 ENCOUNTER — Ambulatory Visit
Admission: RE | Admit: 2018-01-22 | Discharge: 2018-01-22 | Disposition: A | Discharge status: Home / Self Care | Payer: BLUE CROSS/BLUE SHIELD | Source: Ambulatory Visit | Attending: Gastroenterology | Admitting: Gastroenterology

## 2018-01-22 DIAGNOSIS — R112 Nausea with vomiting, unspecified: Secondary | ICD-10-CM

## 2018-01-22 DIAGNOSIS — K219 Gastro-esophageal reflux disease without esophagitis: Secondary | ICD-10-CM | POA: Diagnosis not present

## 2018-02-19 ENCOUNTER — Ambulatory Visit (INDEPENDENT_AMBULATORY_CARE_PROVIDER_SITE_OTHER): Payer: BLUE CROSS/BLUE SHIELD | Admitting: Psychiatry

## 2018-02-19 ENCOUNTER — Encounter (HOSPITAL_COMMUNITY): Payer: Self-pay | Admitting: Psychiatry

## 2018-02-19 DIAGNOSIS — F99 Mental disorder, not otherwise specified: Secondary | ICD-10-CM

## 2018-02-19 DIAGNOSIS — F4310 Post-traumatic stress disorder, unspecified: Secondary | ICD-10-CM | POA: Diagnosis not present

## 2018-02-19 DIAGNOSIS — F411 Generalized anxiety disorder: Secondary | ICD-10-CM

## 2018-02-19 DIAGNOSIS — F319 Bipolar disorder, unspecified: Secondary | ICD-10-CM | POA: Diagnosis not present

## 2018-02-19 DIAGNOSIS — F900 Attention-deficit hyperactivity disorder, predominantly inattentive type: Secondary | ICD-10-CM

## 2018-02-19 DIAGNOSIS — F5105 Insomnia due to other mental disorder: Secondary | ICD-10-CM

## 2018-02-19 MED ORDER — AMPHETAMINE-DEXTROAMPHET ER 20 MG PO CP24: 20.0000 mg | ORAL_CAPSULE | Freq: Two times a day (BID) | ORAL | 0 refills | Status: DC

## 2018-02-19 MED ORDER — BUSPIRONE HCL 15 MG PO TABS: 15.0000 mg | ORAL_TABLET | Freq: Three times a day (TID) | ORAL | 1 refills | Status: DC

## 2018-02-19 MED ORDER — TRAZODONE HCL 100 MG PO TABS: 100.0000 mg | ORAL_TABLET | Freq: Every evening | ORAL | 0 refills | Status: DC | PRN

## 2018-02-19 MED ORDER — ARIPIPRAZOLE 30 MG PO TABS: 30.0000 mg | ORAL_TABLET | Freq: Every day | ORAL | 1 refills | Status: DC

## 2018-02-19 MED ORDER — GABAPENTIN 300 MG PO CAPS: ORAL_CAPSULE | ORAL | 1 refills | Status: DC

## 2018-02-19 MED ORDER — SERTRALINE HCL 50 MG PO TABS: 50.0000 mg | ORAL_TABLET | Freq: Every day | ORAL | 1 refills | Status: DC
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
## 2018-03-24 DIAGNOSIS — R112 Nausea with vomiting, unspecified: Secondary | ICD-10-CM | POA: Diagnosis not present

## 2018-03-24 DIAGNOSIS — R633 Feeding difficulties: Secondary | ICD-10-CM | POA: Diagnosis not present

## 2018-04-09 DIAGNOSIS — F419 Anxiety disorder, unspecified: Secondary | ICD-10-CM | POA: Diagnosis not present

## 2018-04-09 DIAGNOSIS — F324 Major depressive disorder, single episode, in partial remission: Secondary | ICD-10-CM | POA: Diagnosis not present

## 2018-04-09 DIAGNOSIS — Z23 Encounter for immunization: Secondary | ICD-10-CM | POA: Diagnosis not present

## 2018-04-09 DIAGNOSIS — F319 Bipolar disorder, unspecified: Secondary | ICD-10-CM | POA: Diagnosis not present

## 2018-04-23 ENCOUNTER — Encounter (HOSPITAL_COMMUNITY): Payer: Self-pay | Admitting: Psychiatry

## 2018-04-23 ENCOUNTER — Ambulatory Visit (INDEPENDENT_AMBULATORY_CARE_PROVIDER_SITE_OTHER): Payer: BLUE CROSS/BLUE SHIELD | Admitting: Psychiatry

## 2018-04-23 DIAGNOSIS — F411 Generalized anxiety disorder: Secondary | ICD-10-CM

## 2018-04-23 DIAGNOSIS — F319 Bipolar disorder, unspecified: Secondary | ICD-10-CM

## 2018-04-23 DIAGNOSIS — F900 Attention-deficit hyperactivity disorder, predominantly inattentive type: Secondary | ICD-10-CM

## 2018-04-23 DIAGNOSIS — F5105 Insomnia due to other mental disorder: Secondary | ICD-10-CM

## 2018-04-23 DIAGNOSIS — F4310 Post-traumatic stress disorder, unspecified: Secondary | ICD-10-CM

## 2018-04-23 DIAGNOSIS — F99 Mental disorder, not otherwise specified: Secondary | ICD-10-CM

## 2018-04-23 MED ORDER — GABAPENTIN 300 MG PO CAPS: 300.0000 mg | ORAL_CAPSULE | Freq: Two times a day (BID) | ORAL | 0 refills | Status: DC

## 2018-04-23 MED ORDER — ARIPIPRAZOLE 30 MG PO TABS: 30.0000 mg | ORAL_TABLET | Freq: Every day | ORAL | 0 refills | Status: DC

## 2018-04-23 MED ORDER — AMPHETAMINE-DEXTROAMPHET ER 20 MG PO CP24: 20.0000 mg | ORAL_CAPSULE | Freq: Two times a day (BID) | ORAL | 0 refills | Status: DC

## 2018-04-23 MED ORDER — SERTRALINE HCL 100 MG PO TABS: 100.0000 mg | ORAL_TABLET | Freq: Every day | ORAL | 0 refills | Status: DC

## 2018-04-23 MED ORDER — BUSPIRONE HCL 15 MG PO TABS: 15.0000 mg | ORAL_TABLET | Freq: Two times a day (BID) | ORAL | 0 refills | Status: DC

## 2018-04-23 MED ORDER — TRAZODONE HCL 100 MG PO TABS: 100.0000 mg | ORAL_TABLET | Freq: Every evening | ORAL | 0 refills | Status: DC | PRN
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
## 2018-04-27 DIAGNOSIS — R110 Nausea: Secondary | ICD-10-CM | POA: Diagnosis not present

## 2018-04-27 DIAGNOSIS — Z980 Intestinal bypass and anastomosis status: Secondary | ICD-10-CM | POA: Diagnosis not present

## 2018-04-27 DIAGNOSIS — K319 Disease of stomach and duodenum, unspecified: Secondary | ICD-10-CM | POA: Diagnosis not present

## 2018-05-05 DIAGNOSIS — K319 Disease of stomach and duodenum, unspecified: Secondary | ICD-10-CM | POA: Diagnosis not present

## 2018-05-16 ENCOUNTER — Encounter (HOSPITAL_COMMUNITY): Payer: Self-pay | Admitting: Psychiatry

## 2018-05-16 ENCOUNTER — Ambulatory Visit (INDEPENDENT_AMBULATORY_CARE_PROVIDER_SITE_OTHER): Payer: BLUE CROSS/BLUE SHIELD | Admitting: Psychiatry

## 2018-05-16 DIAGNOSIS — F411 Generalized anxiety disorder: Secondary | ICD-10-CM

## 2018-05-16 DIAGNOSIS — F900 Attention-deficit hyperactivity disorder, predominantly inattentive type: Secondary | ICD-10-CM | POA: Diagnosis not present

## 2018-05-16 DIAGNOSIS — F319 Bipolar disorder, unspecified: Secondary | ICD-10-CM

## 2018-05-16 DIAGNOSIS — F4310 Post-traumatic stress disorder, unspecified: Secondary | ICD-10-CM

## 2018-05-16 MED ORDER — AMPHETAMINE-DEXTROAMPHET ER 20 MG PO CP24: 20.0000 mg | ORAL_CAPSULE | Freq: Two times a day (BID) | ORAL | 0 refills | Status: DC
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
## 2018-05-23 ENCOUNTER — Ambulatory Visit (HOSPITAL_COMMUNITY): Payer: BLUE CROSS/BLUE SHIELD | Admitting: Psychiatry
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
## 2018-06-25 ENCOUNTER — Ambulatory Visit (HOSPITAL_COMMUNITY): Payer: BLUE CROSS/BLUE SHIELD | Admitting: Psychiatry

## 2018-06-29 ENCOUNTER — Other Ambulatory Visit (HOSPITAL_COMMUNITY): Payer: Self-pay | Admitting: Psychiatry

## 2018-06-29 DIAGNOSIS — F319 Bipolar disorder, unspecified: Secondary | ICD-10-CM

## 2018-06-29 DIAGNOSIS — F411 Generalized anxiety disorder: Secondary | ICD-10-CM

## 2018-06-29 DIAGNOSIS — F4310 Post-traumatic stress disorder, unspecified: Secondary | ICD-10-CM

## 2018-06-29 DIAGNOSIS — F5105 Insomnia due to other mental disorder: Secondary | ICD-10-CM

## 2018-06-29 DIAGNOSIS — F99 Mental disorder, not otherwise specified: Principal | ICD-10-CM

## 2019-01-14 ENCOUNTER — Other Ambulatory Visit: Payer: Self-pay

## 2019-01-14 ENCOUNTER — Ambulatory Visit (INDEPENDENT_AMBULATORY_CARE_PROVIDER_SITE_OTHER): Payer: BLUE CROSS/BLUE SHIELD | Admitting: Psychiatry

## 2019-01-14 DIAGNOSIS — Z5329 Procedure and treatment not carried out because of patient's decision for other reasons: Secondary | ICD-10-CM

## 2019-01-14 NOTE — Progress Notes (Signed)
I called the patient on the mobile phone listed on the chart.  There was no answer.  The mailbox was full so I was unable to leave a voice message.  I called the home number listed on the chart and there was no answer.  I left a voice message asking the patient to call the clinic back.  I was unable to speak with the patient today.

## 2019-03-31 ENCOUNTER — Telehealth (HOSPITAL_COMMUNITY): Payer: Self-pay | Admitting: *Deleted

## 2019-03-31 NOTE — Telephone Encounter (Signed)
Pt left message requesting refills on all medications. With the exception of the Adderall, all meds were filled in 04/2018. Adderall last filled 05/16/18. Pt does have an appointment upcoming on 05/20/19. Please review and advise.

## 2019-04-05 ENCOUNTER — Emergency Department (HOSPITAL_COMMUNITY)
Admission: EM | Admit: 2019-04-05 | Discharge: 2019-04-06 | Disposition: A | Discharge status: Home / Self Care | Payer: BC Managed Care – PPO | Attending: Emergency Medicine | Admitting: Emergency Medicine

## 2019-04-05 ENCOUNTER — Other Ambulatory Visit: Payer: Self-pay

## 2019-04-05 DIAGNOSIS — Z046 Encounter for general psychiatric examination, requested by authority: Secondary | ICD-10-CM | POA: Diagnosis not present

## 2019-04-05 DIAGNOSIS — F319 Bipolar disorder, unspecified: Secondary | ICD-10-CM | POA: Insufficient documentation

## 2019-04-05 DIAGNOSIS — F909 Attention-deficit hyperactivity disorder, unspecified type: Secondary | ICD-10-CM | POA: Diagnosis not present

## 2019-04-05 DIAGNOSIS — F17210 Nicotine dependence, cigarettes, uncomplicated: Secondary | ICD-10-CM | POA: Diagnosis not present

## 2019-04-05 DIAGNOSIS — F1110 Opioid abuse, uncomplicated: Secondary | ICD-10-CM | POA: Diagnosis not present

## 2019-04-05 DIAGNOSIS — Y907 Blood alcohol level of 200-239 mg/100 ml: Secondary | ICD-10-CM | POA: Diagnosis not present

## 2019-04-05 DIAGNOSIS — F10920 Alcohol use, unspecified with intoxication, uncomplicated: Secondary | ICD-10-CM | POA: Diagnosis not present

## 2019-04-05 DIAGNOSIS — Z03818 Encounter for observation for suspected exposure to other biological agents ruled out: Secondary | ICD-10-CM | POA: Diagnosis not present

## 2019-04-05 DIAGNOSIS — F10120 Alcohol abuse with intoxication, uncomplicated: Secondary | ICD-10-CM | POA: Diagnosis not present

## 2019-04-05 DIAGNOSIS — F1210 Cannabis abuse, uncomplicated: Secondary | ICD-10-CM | POA: Insufficient documentation

## 2019-04-05 DIAGNOSIS — F411 Generalized anxiety disorder: Secondary | ICD-10-CM

## 2019-04-05 DIAGNOSIS — R259 Unspecified abnormal involuntary movements: Secondary | ICD-10-CM | POA: Diagnosis not present

## 2019-04-05 DIAGNOSIS — Z79899 Other long term (current) drug therapy: Secondary | ICD-10-CM | POA: Diagnosis not present

## 2019-04-05 DIAGNOSIS — F4310 Post-traumatic stress disorder, unspecified: Secondary | ICD-10-CM

## 2019-04-05 LAB — RAPID URINE DRUG SCREEN, HOSP PERFORMED
Amphetamines: NOT DETECTED
Barbiturates: NOT DETECTED
Benzodiazepines: NOT DETECTED
Cocaine: NOT DETECTED
Opiates: POSITIVE — AB
Tetrahydrocannabinol: POSITIVE — AB

## 2019-04-05 LAB — BASIC METABOLIC PANEL
Anion gap: 12 (ref 5–15)
BUN: 7 mg/dL (ref 6–20)
CO2: 28 mmol/L (ref 22–32)
Calcium: 9 mg/dL (ref 8.9–10.3)
Chloride: 101 mmol/L (ref 98–111)
Creatinine, Ser: 0.98 mg/dL (ref 0.61–1.24)
GFR calc Af Amer: >60 mL/min (ref >60)
GFR calc non Af Amer: >60 mL/min (ref >60)
Glucose, Bld: 123 mg/dL — ABNORMAL HIGH (ref 70–99)
Potassium: 3.3 mmol/L — ABNORMAL LOW (ref 3.5–5.1)
Sodium: 141 mmol/L (ref 135–145)

## 2019-04-05 LAB — CBC WITH DIFFERENTIAL/PLATELET
Abs Immature Granulocytes: 0.01 10*3/uL (ref 0.00–0.07)
Basophils Absolute: 0.1 10*3/uL (ref 0.0–0.1)
Basophils Relative: 1 %
Eosinophils Absolute: 0 10*3/uL (ref 0.0–0.5)
Eosinophils Relative: 0 %
HCT: 49.8 % (ref 39.0–52.0)
Hemoglobin: 16.4 g/dL (ref 13.0–17.0)
Immature Granulocytes: 0 %
Lymphocytes Relative: 51 %
Lymphs Abs: 4.6 10*3/uL — ABNORMAL HIGH (ref 0.7–4.0)
MCH: 28.5 pg (ref 26.0–34.0)
MCHC: 32.9 g/dL (ref 30.0–36.0)
MCV: 86.6 fL (ref 80.0–100.0)
Monocytes Absolute: 0.7 10*3/uL (ref 0.1–1.0)
Monocytes Relative: 7 %
Neutro Abs: 3.8 10*3/uL (ref 1.7–7.7)
Neutrophils Relative %: 41 %
Platelets: 382 10*3/uL (ref 150–400)
RBC: 5.75 MIL/uL (ref 4.22–5.81)
RDW: 13.3 % (ref 11.5–15.5)
WBC: 9.1 10*3/uL (ref 4.0–10.5)
nRBC: 0 % (ref 0.0–0.2)

## 2019-04-05 NOTE — ED Triage Notes (Signed)
Pt IVC'd by mother for verbalizing suicidal ideation threat to shoot self in face. Pt has been off his medications for Dx Bipolar 1 and PTSD
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
## 2019-04-06 ENCOUNTER — Encounter (HOSPITAL_COMMUNITY): Payer: Self-pay | Admitting: Clinical

## 2019-04-06 LAB — HEPATIC FUNCTION PANEL
ALT: 33 U/L (ref 0–44)
AST: 41 U/L (ref 15–41)
Albumin: 4.7 g/dL (ref 3.5–5.0)
Alkaline Phosphatase: 115 U/L (ref 38–126)
Bilirubin, Direct: 0.1 mg/dL (ref 0.0–0.2)
Indirect Bilirubin: 0.3 mg/dL (ref 0.3–0.9)
Total Bilirubin: 0.4 mg/dL (ref 0.3–1.2)
Total Protein: 8.3 g/dL — ABNORMAL HIGH (ref 6.5–8.1)

## 2019-04-06 LAB — ACETAMINOPHEN LEVEL: Acetaminophen (Tylenol), Serum: <10 ug/mL — ABNORMAL LOW (ref 10–30)

## 2019-04-06 LAB — SALICYLATE LEVEL: Salicylate Lvl: <7 mg/dL (ref 2.8–30.0)

## 2019-04-06 LAB — ETHANOL: Alcohol, Ethyl (B): 230 mg/dL — ABNORMAL HIGH (ref <10)

## 2019-04-06 MED ORDER — ARIPIPRAZOLE 20 MG PO TABS: 30.0000 mg | ORAL_TABLET | Freq: Every day | ORAL | 0 refills | Status: DC

## 2019-04-06 NOTE — ED Notes (Signed)
Pt discharged home. Discharged instructions read to pt who verbalized understanding. All belongings returned to pt. Denies SI/HI, is not delusional and not responding to internal stimuli. Escorted pt to the ED exit.   
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
## 2019-04-06 NOTE — BH Assessment (Signed)
[facility] Assessment Progress Note  Per Neita Garnet, MD, this pt does not require psychiatric hospitalization at this time.  Pt presents under IVC initiated by pt's mother and upheld by EDP Joseph Berkshire, MD, which Dr Parke Poisson has rescinded.  Pt is to be discharged from [facility] with recommendation to continue treatment with his unspecified current outpatient provider.  This has been included in pt's discharge instructions.  Pt's nurse, Diane, has been notified.  Jalene Mullet, [place] Triage Specialist [phone]

## 2019-04-06 NOTE — ED Notes (Signed)
SARS test not completed because pt is being discharged. It was ordered in case of admission.

## 2019-04-06 NOTE — Discharge Instructions (Signed)
For your behavioral health needs, you are advised to follow up with your current outpatient provider.

## 2019-04-06 NOTE — Patient Outreach (Signed)
[facility] met with the patient in order to provide substance use recovery support and provide information for substance use recovery resources. Patient reports a history of alcohol use, but denies a history of active alcohol addiction. Patient denies a history of drug use. Patient's UDS was positive for opiates and cannabis. Patient states his alcohol use is manageable at this time, and the patient does not want help with getting connected to substance use recovery resources at this time. CPSS still talked to the patient about available substance use recovery resources including residential/outpatient substance use treatment and local substance use recovery support groups like 12-step meetings like NA or AA. CPSS still provided the patient with information for substance use recovery resources including residential/outpatient substance use treatment center list, NA meeting list, and [facility] contact information. [facility] strongly encouraged the patient to follow up with [facility] if needed for further help with [facility] substance use recovery related services.

## 2019-04-06 NOTE — Progress Notes (Signed)
Received Nathan Vincent last PM from triage with an escort. He immediately went to bed and slept throughout the night.
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
## 2019-04-08 NOTE — Telephone Encounter (Signed)
No refills without an appt

## 2019-04-09 NOTE — Telephone Encounter (Signed)
Medication management - Message left for patient on voicemail no refill orders would be prescribed for patient until evaluated by Dr. Doyne Keel, set for 05/19/18 due to patient's last eval 05/16/18.  Pt. to call back if questions.

## 2019-04-22 ENCOUNTER — Other Ambulatory Visit: Payer: Self-pay

## 2019-04-22 DIAGNOSIS — F1290 Cannabis use, unspecified, uncomplicated: Secondary | ICD-10-CM | POA: Insufficient documentation

## 2019-04-22 DIAGNOSIS — U071 COVID-19: Secondary | ICD-10-CM | POA: Diagnosis not present

## 2019-04-22 DIAGNOSIS — F329 Major depressive disorder, single episode, unspecified: Secondary | ICD-10-CM | POA: Diagnosis not present

## 2019-04-22 DIAGNOSIS — F319 Bipolar disorder, unspecified: Secondary | ICD-10-CM | POA: Insufficient documentation

## 2019-04-22 DIAGNOSIS — F10929 Alcohol use, unspecified with intoxication, unspecified: Secondary | ICD-10-CM | POA: Diagnosis not present

## 2019-04-22 DIAGNOSIS — Z9114 Patient's other noncompliance with medication regimen: Secondary | ICD-10-CM | POA: Insufficient documentation

## 2019-04-22 DIAGNOSIS — F17210 Nicotine dependence, cigarettes, uncomplicated: Secondary | ICD-10-CM | POA: Diagnosis not present

## 2019-04-22 DIAGNOSIS — R45851 Suicidal ideations: Secondary | ICD-10-CM | POA: Diagnosis not present

## 2019-04-22 NOTE — ED Notes (Signed)
Pt called again for triage, pt came to the triage door and turned around and went back to the restroom.

## 2019-04-22 NOTE — ED Notes (Signed)
Pt called for triage x2, pt not in lobby at this time.

## 2019-04-23 ENCOUNTER — Other Ambulatory Visit: Payer: Self-pay

## 2019-04-23 ENCOUNTER — Observation Stay (HOSPITAL_COMMUNITY): Admission: AD | Admit: 2019-04-23 | Payer: BC Managed Care – PPO | Source: Intra-hospital | Admitting: Psychiatry

## 2019-04-23 ENCOUNTER — Emergency Department (HOSPITAL_COMMUNITY)
Admission: EM | Admit: 2019-04-23 | Discharge: 2019-04-23 | Disposition: A | Discharge status: Home / Self Care | Payer: BC Managed Care – PPO | Attending: Emergency Medicine | Admitting: Emergency Medicine

## 2019-04-23 ENCOUNTER — Encounter (HOSPITAL_COMMUNITY): Payer: Self-pay

## 2019-04-23 DIAGNOSIS — R45851 Suicidal ideations: Secondary | ICD-10-CM

## 2019-04-23 DIAGNOSIS — F314 Bipolar disorder, current episode depressed, severe, without psychotic features: Secondary | ICD-10-CM | POA: Diagnosis present

## 2019-04-23 LAB — COMPREHENSIVE METABOLIC PANEL
ALT: 25 U/L (ref 0–44)
AST: 36 U/L (ref 15–41)
Albumin: 4.6 g/dL (ref 3.5–5.0)
Alkaline Phosphatase: 87 U/L (ref 38–126)
Anion gap: 8 (ref 5–15)
BUN: 7 mg/dL (ref 6–20)
CO2: 29 mmol/L (ref 22–32)
Calcium: 9 mg/dL (ref 8.9–10.3)
Chloride: 103 mmol/L (ref 98–111)
Creatinine, Ser: 0.95 mg/dL (ref 0.61–1.24)
GFR calc Af Amer: >60 mL/min (ref >60)
GFR calc non Af Amer: >60 mL/min (ref >60)
Glucose, Bld: 131 mg/dL — ABNORMAL HIGH (ref 70–99)
Potassium: 3.4 mmol/L — ABNORMAL LOW (ref 3.5–5.1)
Sodium: 140 mmol/L (ref 135–145)
Total Bilirubin: 0.6 mg/dL (ref 0.3–1.2)
Total Protein: 8.1 g/dL (ref 6.5–8.1)

## 2019-04-23 LAB — RAPID URINE DRUG SCREEN, HOSP PERFORMED
Amphetamines: NOT DETECTED
Barbiturates: NOT DETECTED
Benzodiazepines: NOT DETECTED
Cocaine: NOT DETECTED
Opiates: NOT DETECTED
Tetrahydrocannabinol: POSITIVE — AB

## 2019-04-23 LAB — CBC
HCT: 49.2 % (ref 39.0–52.0)
Hemoglobin: 15.8 g/dL (ref 13.0–17.0)
MCH: 28.4 pg (ref 26.0–34.0)
MCHC: 32.1 g/dL (ref 30.0–36.0)
MCV: 88.3 fL (ref 80.0–100.0)
Platelets: 359 10*3/uL (ref 150–400)
RBC: 5.57 MIL/uL (ref 4.22–5.81)
RDW: 14.4 % (ref 11.5–15.5)
WBC: 10.3 10*3/uL (ref 4.0–10.5)
nRBC: 0 % (ref 0.0–0.2)

## 2019-04-23 LAB — ACETAMINOPHEN LEVEL: Acetaminophen (Tylenol), Serum: <10 ug/mL — ABNORMAL LOW (ref 10–30)

## 2019-04-23 LAB — ETHANOL: Alcohol, Ethyl (B): <10 mg/dL (ref <10)

## 2019-04-23 LAB — SALICYLATE LEVEL: Salicylate Lvl: <7 mg/dL (ref 2.8–30.0)

## 2019-04-23 LAB — SARS CORONAVIRUS 2 (TAT 6-24 HRS): SARS Coronavirus 2: POSITIVE — AB

## 2019-04-23 MED ORDER — NICOTINE 21 MG/24HR TD PT24: 21.0000 mg | MEDICATED_PATCH | Freq: Every day | TRANSDERMAL | Status: DC

## 2019-04-23 MED ORDER — SILVER SULFADIAZINE 1 % EX CREA
TOPICAL_CREAM | Freq: Two times a day (BID) | CUTANEOUS | Status: DC
  Administered 2019-04-23: 1 via TOPICAL
  Filled 2019-04-23: qty 50

## 2019-04-23 NOTE — BHH Suicide Risk Assessment (Cosign Needed)
Suicide Risk Assessment  Discharge Assessment   [facility] Discharge Suicide Risk Assessment   Principal Problem: Bipolar I disorder, most recent episode depressed, severe without psychotic features [facility]) Discharge Diagnoses: Principal Problem:   Bipolar I disorder, most recent episode depressed, severe without psychotic features ([place])   Total Time spent with patient: 30 minutes  Musculoskeletal: Strength & Muscle Tone: within normal limits Gait & Station: normal Patient leans: N/A  Psychiatric Specialty Exam:   Blood pressure (!) 130/94, pulse (!) 125, temperature 98.3 F (36.8 C), temperature source Oral, resp. rate 16, SpO2 100 %.There is no height or weight on file to calculate BMI.  General Appearance: Casual  Eye Contact::  Good  Speech:  Clear and Coherent and Normal Rate409  Volume:  Normal  Mood:  Anxious  Affect:  Appropriate and Congruent  Thought Process:  Coherent, Goal Directed and Descriptions of Associations: Intact  Orientation:  Full (Time, Place, and Person)  Thought Content:  WDL and Logical  Suicidal Thoughts:  Yes.  without intent/plan  Homicidal Thoughts:  No  Memory:  Immediate;   Good Recent;   Good Remote;   Good  Judgement:  Fair  Insight:  Fair  Psychomotor Activity:  Normal  Concentration:  Good  Recall:  Good  Fund of Knowledge:Good  Language: Good  Akathisia:  No  Handed:  Right  AIMS (if indicated):     Assets:  Communication Skills Desire for Improvement Financial Resources/Insurance Social Support  Sleep:     Cognition: WNL  ADL's:  Intact   Mental Status Per Nursing Assessment::   On Admission:   Patient seen by nurse practitioner along with Dr. Dwyane Dee.  Patient alert and oriented answers questions appropriately.  Patient reports "I want to get stabilized."  Patient endorses chronic suicidal ideations.  Patient denies plan or intent to harm self.  Patient denies access to weapons.  Patient denies homicidal ideations.  Patient denies  hallucinations.  Patient reports he is currently homeless, endorses substance use including "weed and alcohol."  Patient reports last use of alcohol "a couple of weeks ago."  Patient offered peers support consult, patient refuses peers support at this time.  Patient followed by Dr. Doyne Keel outpatient medications include Abilify, Zoloft and Strattera.  Patient reports neck scheduled appointment in January 2020.  Patient gives verbal consent to speak with Champ Mungo number [phone]. Spoke with fianc Stanton Kidney: Stanton Kidney states that patient is unemployed but looking for a job.  Stanton Kidney denies concerns for patient safety states "no he would not hurt himself or anyone else."  Stanton Kidney denies patient has access to weapons.  Patient seen along with Dr. Dwyane Dee who agrees with recommendation for discharge follow-up with established outpatient provider.  Demographic Factors:  Male and Caucasian  Loss Factors: NA  Historical Factors: Prior suicide attempts  Risk Reduction Factors:   Positive social support, Positive therapeutic relationship and Positive coping skills or problem solving skills  Continued Clinical Symptoms:  Alcohol/Substance Abuse/Dependencies  Cognitive Features That Contribute To Risk:  None    Suicide Risk:  Minimal: No identifiable suicidal ideation.  Patients presenting with no risk factors but with morbid ruminations; may be classified as minimal risk based on the severity of the depressive symptoms    Plan Of Care/Follow-up recommendations:  Other:  Follow up with established outpatient psychiatry  Emmaline Kluver, FNP 04/23/2019, 10:34 AM
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
## 2019-04-23 NOTE — ED Notes (Signed)
Patient discharged. Stated he has no where to go and will try to [facility] with [facility]. Patient's clothes and belongings returned to patient. Patient's 2 personal bags returned to patient and patient was walked to ED exit.

## 2019-04-23 NOTE — BH Assessment (Signed)
[place] Assessment Progress Note  Per Letitia Libra, FNP, this pt does not require psychiatric hospitalization at this time.  Pt is to be discharged from [facility] with recommendation to continue treatment with Charlcie Cradle, MD at the [facility] [place].  Pt has an appointment currently scheduled for Thursday, 05/20/2019 at 14:45.  This has been included in pt's discharge instructions.  Pt's nurse, Melody, has been notified.  Jalene Mullet, [place] Triage Specialist [phone]

## 2019-04-23 NOTE — ED Triage Notes (Signed)
Pt reports that he would like to be "stabilized on his medication." Reports that he has some of it, but doesn't take it like he should. Pt reports that he has has suicidal ideation, but no plan. A&Ox4.

## 2019-04-23 NOTE — Discharge Instructions (Signed)
For your behavioral health needs, you are advised to continue treatment with Charlcie Cradle, MD.  Plan to keep your virtual appointment previously scheduled for Thursday, May 20, 2019 at 2:45 pm:       Charlcie Cradle, MD      [facility] [place]. [address] [place], [place] [address]      [phone]
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
## 2019-04-24 ENCOUNTER — Observation Stay (HOSPITAL_COMMUNITY)
Admission: EM | Admit: 2019-04-24 | Discharge: 2019-04-25 | Disposition: A | Discharge status: Home / Self Care | Payer: BC Managed Care – PPO | Attending: Psychiatry | Admitting: Psychiatry

## 2019-04-24 ENCOUNTER — Other Ambulatory Visit: Payer: Self-pay

## 2019-04-24 ENCOUNTER — Encounter (HOSPITAL_COMMUNITY): Payer: Self-pay | Admitting: *Deleted

## 2019-04-24 DIAGNOSIS — F4310 Post-traumatic stress disorder, unspecified: Secondary | ICD-10-CM | POA: Insufficient documentation

## 2019-04-24 DIAGNOSIS — F319 Bipolar disorder, unspecified: Secondary | ICD-10-CM | POA: Diagnosis present

## 2019-04-24 DIAGNOSIS — F909 Attention-deficit hyperactivity disorder, unspecified type: Secondary | ICD-10-CM | POA: Insufficient documentation

## 2019-04-24 DIAGNOSIS — F29 Unspecified psychosis not due to a substance or known physiological condition: Secondary | ICD-10-CM | POA: Insufficient documentation

## 2019-04-24 DIAGNOSIS — F1210 Cannabis abuse, uncomplicated: Secondary | ICD-10-CM | POA: Diagnosis not present

## 2019-04-24 DIAGNOSIS — F19959 Other psychoactive substance use, unspecified with psychoactive substance-induced psychotic disorder, unspecified: Secondary | ICD-10-CM | POA: Diagnosis not present

## 2019-04-24 DIAGNOSIS — F17210 Nicotine dependence, cigarettes, uncomplicated: Secondary | ICD-10-CM | POA: Diagnosis not present

## 2019-04-24 DIAGNOSIS — Z915 Personal history of self-harm: Secondary | ICD-10-CM | POA: Diagnosis not present

## 2019-04-24 DIAGNOSIS — K219 Gastro-esophageal reflux disease without esophagitis: Secondary | ICD-10-CM | POA: Diagnosis not present

## 2019-04-24 DIAGNOSIS — T1491XA Suicide attempt, initial encounter: Secondary | ICD-10-CM | POA: Diagnosis not present

## 2019-04-24 DIAGNOSIS — R45851 Suicidal ideations: Secondary | ICD-10-CM | POA: Diagnosis not present

## 2019-04-24 DIAGNOSIS — Z79899 Other long term (current) drug therapy: Secondary | ICD-10-CM | POA: Insufficient documentation

## 2019-04-24 DIAGNOSIS — F411 Generalized anxiety disorder: Secondary | ICD-10-CM | POA: Diagnosis not present

## 2019-04-24 DIAGNOSIS — Z91018 Allergy to other foods: Secondary | ICD-10-CM | POA: Diagnosis not present

## 2019-04-24 DIAGNOSIS — F419 Anxiety disorder, unspecified: Secondary | ICD-10-CM | POA: Diagnosis not present

## 2019-04-24 DIAGNOSIS — U071 COVID-19: Secondary | ICD-10-CM | POA: Diagnosis not present

## 2019-04-24 LAB — COMPREHENSIVE METABOLIC PANEL
ALT: 24 U/L (ref 0–44)
AST: 35 U/L (ref 15–41)
Albumin: 4.4 g/dL (ref 3.5–5.0)
Alkaline Phosphatase: 83 U/L (ref 38–126)
Anion gap: 10 (ref 5–15)
BUN: <5 mg/dL — ABNORMAL LOW (ref 6–20)
CO2: 26 mmol/L (ref 22–32)
Calcium: 9 mg/dL (ref 8.9–10.3)
Chloride: 107 mmol/L (ref 98–111)
Creatinine, Ser: 0.76 mg/dL (ref 0.61–1.24)
GFR calc Af Amer: >60 mL/min (ref >60)
GFR calc non Af Amer: >60 mL/min (ref >60)
Glucose, Bld: 115 mg/dL — ABNORMAL HIGH (ref 70–99)
Potassium: 3.1 mmol/L — ABNORMAL LOW (ref 3.5–5.1)
Sodium: 143 mmol/L (ref 135–145)
Total Bilirubin: 0.4 mg/dL (ref 0.3–1.2)
Total Protein: 8 g/dL (ref 6.5–8.1)

## 2019-04-24 LAB — RAPID URINE DRUG SCREEN, HOSP PERFORMED
Amphetamines: NOT DETECTED
Barbiturates: NOT DETECTED
Benzodiazepines: NOT DETECTED
Cocaine: NOT DETECTED
Opiates: NOT DETECTED
Tetrahydrocannabinol: POSITIVE — AB

## 2019-04-24 LAB — CBC
HCT: 48.6 % (ref 39.0–52.0)
Hemoglobin: 16 g/dL (ref 13.0–17.0)
MCH: 29.1 pg (ref 26.0–34.0)
MCHC: 32.9 g/dL (ref 30.0–36.0)
MCV: 88.4 fL (ref 80.0–100.0)
Platelets: 373 10*3/uL (ref 150–400)
RBC: 5.5 MIL/uL (ref 4.22–5.81)
RDW: 14.4 % (ref 11.5–15.5)
WBC: 11.3 10*3/uL — ABNORMAL HIGH (ref 4.0–10.5)
nRBC: 0 % (ref 0.0–0.2)

## 2019-04-24 LAB — ETHANOL: Alcohol, Ethyl (B): 116 mg/dL — ABNORMAL HIGH (ref <10)

## 2019-04-24 LAB — ACETAMINOPHEN LEVEL: Acetaminophen (Tylenol), Serum: <10 ug/mL — ABNORMAL LOW (ref 10–30)

## 2019-04-24 LAB — SALICYLATE LEVEL: Salicylate Lvl: <7 mg/dL (ref 2.8–30.0)

## 2019-04-24 MED ORDER — ACETAMINOPHEN 325 MG PO TABS
650.0000 mg | ORAL_TABLET | Freq: Once | ORAL | Status: AC
  Administered 2019-04-24: 650 mg via ORAL
  Filled 2019-04-24: qty 2

## 2019-04-24 MED ORDER — ONDANSETRON 4 MG PO TBDP
4.0000 mg | ORAL_TABLET | Freq: Once | ORAL | Status: AC
  Administered 2019-04-24: 4 mg via ORAL
  Filled 2019-04-24: qty 1

## 2019-04-24 MED ORDER — ARIPIPRAZOLE 2 MG PO TABS
2.0000 mg | ORAL_TABLET | Freq: Every day | ORAL | Status: DC
  Administered 2019-04-24 – 2019-04-25 (×2): 2 mg via ORAL
  Filled 2019-04-24 (×3): qty 1

## 2019-04-24 MED ORDER — SERTRALINE HCL 50 MG PO TABS
50.0000 mg | ORAL_TABLET | Freq: Every day | ORAL | Status: DC
  Administered 2019-04-25: 50 mg via ORAL
  Filled 2019-04-24 (×2): qty 1

## 2019-04-24 MED ORDER — SERTRALINE HCL 25 MG PO TABS
25.0000 mg | ORAL_TABLET | Freq: Every day | ORAL | Status: DC
  Administered 2019-04-24: 14:00:00 25 mg via ORAL
  Filled 2019-04-24: qty 1

## 2019-04-24 NOTE — ED Notes (Signed)
5 belonging bags placed in locker #4

## 2019-04-24 NOTE — ED Notes (Signed)
Breakfast ordered 

## 2019-04-24 NOTE — ED Notes (Signed)
Mom- [name] [phone] would like a call from someone as soon as possible

## 2019-04-24 NOTE — ED Notes (Signed)
Breakfast given to patient.  Pt up using the bathroom

## 2019-04-24 NOTE — ED Notes (Signed)
ED Provider at bedside. 

## 2019-04-24 NOTE — ED Triage Notes (Signed)
PT's Mother called and reported Pt does not have a place to go to when he is DC.
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
## 2019-04-24 NOTE — ED Triage Notes (Signed)
Pt reports feeling unstable, has been off of his medications for a while. Reports suicidal ideations with a plan but doesn't elaborate. C/o L leg pain. Per chart review, pt seen and psychiatrically cleared at [facility] around noon on 04/23/2019. Pt is covid +

## 2019-04-24 NOTE — ED Notes (Signed)
Lunch tray ordered 

## 2019-04-24 NOTE — ED Notes (Signed)
Pt had one episode of emesis. Reports feeling generalized aching and nausea. PA made aware.
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
## 2019-04-24 NOTE — ED Notes (Signed)
Dinner tray ordered.

## 2019-04-24 NOTE — ED Notes (Signed)
Family updated. Mother requests that she be notified if patient is discharged.

## 2019-04-25 ENCOUNTER — Encounter (HOSPITAL_COMMUNITY): Payer: Self-pay | Admitting: Emergency Medicine

## 2019-04-25 ENCOUNTER — Emergency Department (EMERGENCY_DEPARTMENT_HOSPITAL)
Admission: EM | Admit: 2019-04-25 | Discharge: 2019-04-26 | Disposition: A | Discharge status: Nursing Home (Includes ICF) | Payer: BC Managed Care – PPO | Source: Home / Self Care | Attending: Emergency Medicine | Admitting: Emergency Medicine

## 2019-04-25 ENCOUNTER — Other Ambulatory Visit: Payer: Self-pay

## 2019-04-25 DIAGNOSIS — U071 COVID-19: Secondary | ICD-10-CM

## 2019-04-25 DIAGNOSIS — F17210 Nicotine dependence, cigarettes, uncomplicated: Secondary | ICD-10-CM | POA: Insufficient documentation

## 2019-04-25 DIAGNOSIS — F1010 Alcohol abuse, uncomplicated: Secondary | ICD-10-CM | POA: Insufficient documentation

## 2019-04-25 DIAGNOSIS — R45851 Suicidal ideations: Secondary | ICD-10-CM | POA: Insufficient documentation

## 2019-04-25 DIAGNOSIS — F319 Bipolar disorder, unspecified: Secondary | ICD-10-CM | POA: Insufficient documentation

## 2019-04-25 DIAGNOSIS — F1210 Cannabis abuse, uncomplicated: Secondary | ICD-10-CM | POA: Insufficient documentation

## 2019-04-25 DIAGNOSIS — F19959 Other psychoactive substance use, unspecified with psychoactive substance-induced psychotic disorder, unspecified: Secondary | ICD-10-CM

## 2019-04-25 DIAGNOSIS — F29 Unspecified psychosis not due to a substance or known physiological condition: Secondary | ICD-10-CM | POA: Diagnosis present

## 2019-04-25 DIAGNOSIS — F1810 Inhalant abuse, uncomplicated: Secondary | ICD-10-CM | POA: Insufficient documentation

## 2019-04-25 DIAGNOSIS — F4310 Post-traumatic stress disorder, unspecified: Secondary | ICD-10-CM | POA: Diagnosis present

## 2019-04-25 DIAGNOSIS — F411 Generalized anxiety disorder: Secondary | ICD-10-CM | POA: Diagnosis present

## 2019-04-25 DIAGNOSIS — Z79899 Other long term (current) drug therapy: Secondary | ICD-10-CM | POA: Insufficient documentation

## 2019-04-25 DIAGNOSIS — F1910 Other psychoactive substance abuse, uncomplicated: Secondary | ICD-10-CM

## 2019-04-25 DIAGNOSIS — F909 Attention-deficit hyperactivity disorder, unspecified type: Secondary | ICD-10-CM | POA: Diagnosis present

## 2019-04-25 LAB — I-STAT CHEM 8, ED
BUN: 4 mg/dL — ABNORMAL LOW (ref 6–20)
Calcium, Ion: 1.14 mmol/L — ABNORMAL LOW (ref 1.15–1.40)
Chloride: 101 mmol/L (ref 98–111)
Creatinine, Ser: 0.8 mg/dL (ref 0.61–1.24)
Glucose, Bld: 94 mg/dL (ref 70–99)
HCT: 49 % (ref 39.0–52.0)
Hemoglobin: 16.7 g/dL (ref 13.0–17.0)
Potassium: 3.3 mmol/L — ABNORMAL LOW (ref 3.5–5.1)
Sodium: 137 mmol/L (ref 135–145)
TCO2: 29 mmol/L (ref 22–32)

## 2019-04-25 MED ORDER — SERTRALINE HCL 50 MG PO TABS
50.0000 mg | ORAL_TABLET | Freq: Every day | ORAL | Status: DC
  Administered 2019-04-25 – 2019-04-26 (×2): 50 mg via ORAL
  Filled 2019-04-25 (×2): qty 1

## 2019-04-25 MED ORDER — PANTOPRAZOLE SODIUM 40 MG PO TBEC
40.0000 mg | DELAYED_RELEASE_TABLET | Freq: Every day | ORAL | Status: DC
  Administered 2019-04-25 – 2019-04-26 (×2): 40 mg via ORAL
  Filled 2019-04-25 (×2): qty 1

## 2019-04-25 MED ORDER — ARIPIPRAZOLE 10 MG PO TABS
30.0000 mg | ORAL_TABLET | Freq: Every day | ORAL | Status: DC
  Administered 2019-04-25 – 2019-04-26 (×2): 30 mg via ORAL
  Filled 2019-04-25 (×2): qty 3

## 2019-04-25 MED ORDER — ATOMOXETINE HCL 40 MG PO CAPS
40.0000 mg | ORAL_CAPSULE | Freq: Every day | ORAL | Status: DC
  Administered 2019-04-25: 22:00:00 40 mg via ORAL
  Filled 2019-04-25 (×2): qty 1

## 2019-04-25 MED ORDER — MIRTAZAPINE 7.5 MG PO TABS
15.0000 mg | ORAL_TABLET | Freq: Every day | ORAL | Status: DC
  Administered 2019-04-25: 22:00:00 15 mg via ORAL
  Filled 2019-04-25: qty 2

## 2019-04-25 MED ORDER — POTASSIUM CHLORIDE CRYS ER 20 MEQ PO TBCR
40.0000 meq | EXTENDED_RELEASE_TABLET | Freq: Once | ORAL | Status: AC
  Administered 2019-04-25: 40 meq via ORAL
  Filled 2019-04-25: qty 2

## 2019-04-25 NOTE — ED Triage Notes (Signed)
Pt found by off duty [employer] in restroom laying on ground huffing can of duster spray. Pt reports wanting psychiatric eval for SI. Pt reports plan of taking drugs. Pt recently seen for same at [facility].

## 2019-04-25 NOTE — BHH Counselor (Signed)
Pt denies, family, friend supports. Pt declined, to have clinician contact anyone to gather collateral information.    Vertell Novak, [place], [facility], [facility] Triage Specialist [phone]

## 2019-04-25 NOTE — BHH Counselor (Signed)
Per T. Lewis, Pt is psych-cleared and may be discharged.
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
## 2019-04-25 NOTE — ED Notes (Addendum)
Pt was found inhaling duster spray in the room. The can was removed from the pt. Pts belongings were immediately bagged up and removed from the room. Pts was alert and able to continue a conversation with TTS. Pt does not appear to be in any respiratory distress. Will continue to monitor.
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
## 2019-04-25 NOTE — ED Notes (Signed)
Lunch Tray Ordered@ 1148. 

## 2019-04-25 NOTE — ED Notes (Signed)
Patient verbalizes understanding of discharge instructions. Opportunity for questioning and answers were provided. Pt provided opportunity to make phone calls prior to leaving. Pt denied request for this RN to contact his mother prior to his d/c. Pt belongings returned to him, pt discharged from ED.

## 2019-04-25 NOTE — ED Notes (Signed)
Breakfast ordered 

## 2019-04-26 ENCOUNTER — Encounter (HOSPITAL_COMMUNITY): Payer: Self-pay | Admitting: Registered Nurse

## 2019-04-26 DIAGNOSIS — F4310 Post-traumatic stress disorder, unspecified: Secondary | ICD-10-CM

## 2019-04-26 DIAGNOSIS — F19959 Other psychoactive substance use, unspecified with psychoactive substance-induced psychotic disorder, unspecified: Secondary | ICD-10-CM

## 2019-04-26 DIAGNOSIS — F319 Bipolar disorder, unspecified: Secondary | ICD-10-CM

## 2019-04-26 DIAGNOSIS — Z590 Homelessness: Secondary | ICD-10-CM

## 2019-04-26 DIAGNOSIS — F909 Attention-deficit hyperactivity disorder, unspecified type: Secondary | ICD-10-CM | POA: Diagnosis not present

## 2019-04-26 DIAGNOSIS — U071 COVID-19: Secondary | ICD-10-CM

## 2019-04-26 DIAGNOSIS — F411 Generalized anxiety disorder: Secondary | ICD-10-CM | POA: Diagnosis not present

## 2019-04-26 LAB — RAPID URINE DRUG SCREEN, HOSP PERFORMED
Amphetamines: NOT DETECTED
Barbiturates: NOT DETECTED
Benzodiazepines: NOT DETECTED
Cocaine: NOT DETECTED
Opiates: NOT DETECTED
Tetrahydrocannabinol: POSITIVE — AB

## 2019-04-26 MED ORDER — ARIPIPRAZOLE 30 MG PO TABS: 30.0000 mg | ORAL_TABLET | Freq: Every day | ORAL | 0 refills | Status: DC

## 2019-04-26 NOTE — Progress Notes (Signed)
CSW received call from [facility] who reports patient can go to the quarantine hotel and then go to [employer]. Patient agree and signed necessary paperwork. CSW sent [facility] to [facility] and waiting to hear back about transportation to the hotel by the health dept. CSW will continue to follow up.   Golden Circle, LCSW Transitions of Care Department [facility] ED [phone]

## 2019-04-26 NOTE — ED Notes (Signed)
Pt removed vital signs equipment.

## 2019-04-26 NOTE — ED Provider Notes (Signed)
  Physical Exam  BP 132/83   Pulse 70   Temp 98.2 F (36.8 C) (Oral)   Resp 16   Ht 5\' 11"  (1.803 m)   Wt 63 kg   SpO2 98%   BMI 19.37 kg/m   Physical Exam  ED Course/Procedures     Procedures  MDM  Patient been seen by psychiatry and cleared for discharge.  Will be sent to Covid housing.       Davonna Belling, MD 04/26/19 1745

## 2019-04-26 NOTE — ED Notes (Signed)
Pt is sleeping  In the room. Pt continues to remove monitoring equipment.

## 2019-04-26 NOTE — Progress Notes (Signed)
Consult received. CSW waiting to hear back from [facility] to find out if patient can go to the [employer]. CSW will continue to follow up.   Golden Circle, LCSW Transitions of Care Department [facility] ED [phone]

## 2019-04-26 NOTE — BH Assessment (Signed)
[facility] Assessment Progress Note  Per Shuvon Rankin, FNP, this pt does not require psychiatric hospitalization at this time.  Pt is psychiatrically cleared.  Discharge instructions advise pt to follow up with [facility].  A social work consult has been ordered for pt.  Pt's nurse has been notified.  Jalene Mullet, [facility] Triage Specialist [phone]

## 2019-04-26 NOTE — Discharge Instructions (Signed)
For your mental health needs, you are advised to follow up with [facility].  Call them at your earliest opportunity to schedule an intake appointment:       [facility]      [address]      [place],  [address]      [phone]      Crisis number: [phone]  
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
## 2019-05-05 ENCOUNTER — Other Ambulatory Visit: Payer: Self-pay

## 2019-05-05 ENCOUNTER — Emergency Department (HOSPITAL_COMMUNITY)
Admission: EM | Admit: 2019-05-05 | Discharge: 2019-05-06 | Disposition: A | Discharge status: Home / Self Care | Payer: BC Managed Care – PPO | Attending: Emergency Medicine | Admitting: Emergency Medicine

## 2019-05-05 ENCOUNTER — Encounter (HOSPITAL_COMMUNITY): Payer: Self-pay

## 2019-05-05 DIAGNOSIS — F309 Manic episode, unspecified: Secondary | ICD-10-CM

## 2019-05-05 DIAGNOSIS — Z046 Encounter for general psychiatric examination, requested by authority: Secondary | ICD-10-CM | POA: Diagnosis not present

## 2019-05-05 DIAGNOSIS — F17210 Nicotine dependence, cigarettes, uncomplicated: Secondary | ICD-10-CM | POA: Insufficient documentation

## 2019-05-05 DIAGNOSIS — R45851 Suicidal ideations: Secondary | ICD-10-CM | POA: Diagnosis not present

## 2019-05-05 DIAGNOSIS — F329 Major depressive disorder, single episode, unspecified: Secondary | ICD-10-CM | POA: Diagnosis not present

## 2019-05-05 LAB — COMPREHENSIVE METABOLIC PANEL
ALT: 18 U/L (ref 0–44)
AST: 20 U/L (ref 15–41)
Albumin: 4.4 g/dL (ref 3.5–5.0)
Alkaline Phosphatase: 181 U/L — ABNORMAL HIGH (ref 38–126)
Anion gap: 12 (ref 5–15)
BUN: 9 mg/dL (ref 6–20)
CO2: 25 mmol/L (ref 22–32)
Calcium: 8.8 mg/dL — ABNORMAL LOW (ref 8.9–10.3)
Chloride: 102 mmol/L (ref 98–111)
Creatinine, Ser: 0.82 mg/dL (ref 0.61–1.24)
GFR calc Af Amer: >60 mL/min (ref >60)
GFR calc non Af Amer: >60 mL/min (ref >60)
Glucose, Bld: 104 mg/dL — ABNORMAL HIGH (ref 70–99)
Potassium: 3.6 mmol/L (ref 3.5–5.1)
Sodium: 139 mmol/L (ref 135–145)
Total Bilirubin: 0.3 mg/dL (ref 0.3–1.2)
Total Protein: 7.8 g/dL (ref 6.5–8.1)

## 2019-05-05 LAB — CBC
HCT: 48.5 % (ref 39.0–52.0)
Hemoglobin: 15.7 g/dL (ref 13.0–17.0)
MCH: 28.5 pg (ref 26.0–34.0)
MCHC: 32.4 g/dL (ref 30.0–36.0)
MCV: 88.2 fL (ref 80.0–100.0)
Platelets: 421 10*3/uL — ABNORMAL HIGH (ref 150–400)
RBC: 5.5 MIL/uL (ref 4.22–5.81)
RDW: 14.1 % (ref 11.5–15.5)
WBC: 11.3 10*3/uL — ABNORMAL HIGH (ref 4.0–10.5)
nRBC: 0 % (ref 0.0–0.2)

## 2019-05-05 LAB — RAPID URINE DRUG SCREEN, HOSP PERFORMED
Amphetamines: NOT DETECTED
Barbiturates: NOT DETECTED
Benzodiazepines: NOT DETECTED
Cocaine: NOT DETECTED
Opiates: NOT DETECTED
Tetrahydrocannabinol: NOT DETECTED

## 2019-05-05 LAB — ACETAMINOPHEN LEVEL: Acetaminophen (Tylenol), Serum: <10 ug/mL — ABNORMAL LOW (ref 10–30)

## 2019-05-05 LAB — SALICYLATE LEVEL: Salicylate Lvl: <7 mg/dL — ABNORMAL LOW (ref 7.0–30.0)

## 2019-05-05 LAB — ETHANOL: Alcohol, Ethyl (B): <10 mg/dL (ref <10)

## 2019-05-05 MED ORDER — ATOMOXETINE HCL 40 MG PO CAPS
40.0000 mg | ORAL_CAPSULE | Freq: Once | ORAL | Status: AC
  Administered 2019-05-06: 40 mg via ORAL
  Filled 2019-05-05: qty 1

## 2019-05-05 MED ORDER — ARIPIPRAZOLE 10 MG PO TABS
30.0000 mg | ORAL_TABLET | Freq: Once | ORAL | Status: AC
  Administered 2019-05-06: 30 mg via ORAL
  Filled 2019-05-05: qty 3

## 2019-05-05 MED ORDER — SERTRALINE HCL 50 MG PO TABS: 50.0000 mg | ORAL_TABLET | Freq: Every day | ORAL | Status: DC

## 2019-05-05 NOTE — ED Triage Notes (Signed)
Pt IVC'd by mother. Papers state, "Bipolar. Prescribed antipsychotics but has not been taking them. Respondent told petitioner he does not want to live anymore and has no hope. Respondent has been using inhalents and passed out on the phone while talking. He abuses alcohol daily. Respondent has been suicidal for the past few weeks. 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

## 2019-05-06 MED ORDER — MIRTAZAPINE 15 MG PO TABS: 15.0000 mg | ORAL_TABLET | Freq: Every day | ORAL | 0 refills | Status: AC

## 2019-05-06 MED ORDER — ATOMOXETINE HCL 40 MG PO CAPS: 40.0000 mg | ORAL_CAPSULE | Freq: Every day | ORAL | 0 refills | Status: AC

## 2019-05-06 MED ORDER — SERTRALINE HCL 50 MG PO TABS: 50.0000 mg | ORAL_TABLET | Freq: Every day | ORAL | 0 refills | Status: AC

## 2019-05-06 MED ORDER — PANTOPRAZOLE SODIUM 40 MG PO TBEC: 40.0000 mg | DELAYED_RELEASE_TABLET | Freq: Every day | ORAL | 0 refills | Status: AC

## 2019-05-06 MED ORDER — ARIPIPRAZOLE 30 MG PO TABS: 30.0000 mg | ORAL_TABLET | Freq: Every day | ORAL | 0 refills | Status: AC
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
## 2019-05-06 NOTE — ED Notes (Signed)
Pt refused dc vitals, he has had all of his belongings returned.

## 2019-05-06 NOTE — ED Notes (Signed)
TTS cart at bedside and patient verbalized that he knows how to answer it.
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
## 2019-05-06 NOTE — Progress Notes (Signed)
TTS calling cart, no answer  Lind Covert, MSW, LCSW Therapeutic Triage Specialist  [phone]

## 2019-05-06 NOTE — Progress Notes (Signed)
Talbot Grumbling, NP recommends pt be d/c and IVC rescinded, recommends pt follow up with his current OPT provider. SA resources faxed to [facility] for the pt. EDP [place], Corene Cornea, MD and Azzie Glatter, RN have been advised.  Lind Covert, MSW, LCSW Therapeutic Triage Specialist  [phone]

## 2019-05-06 NOTE — ED Notes (Signed)
3 bags of belongings at triage nurse desk

## 2019-05-20 ENCOUNTER — Ambulatory Visit (INDEPENDENT_AMBULATORY_CARE_PROVIDER_SITE_OTHER): Payer: Self-pay | Admitting: Psychiatry

## 2019-05-20 ENCOUNTER — Other Ambulatory Visit: Payer: Self-pay

## 2019-05-20 DIAGNOSIS — Z5329 Procedure and treatment not carried out because of patient's decision for other reasons: Secondary | ICD-10-CM

## 2019-05-20 NOTE — Progress Notes (Signed)
I called [facility]'s mobile number [phone]) and no one answered. The mailbox was full so I was not able to leave a voice message.  I called the home number [phone]) and there was no answer. I left a voice message telling Stancil that this is our scheduled appointment time and to call the clinic.

## 2021-02-03 DIAGNOSIS — F1120 Opioid dependence, uncomplicated: Secondary | ICD-10-CM | POA: Diagnosis not present

## 2021-02-03 DIAGNOSIS — F1020 Alcohol dependence, uncomplicated: Secondary | ICD-10-CM | POA: Diagnosis not present

## 2021-02-05 DIAGNOSIS — F1120 Opioid dependence, uncomplicated: Secondary | ICD-10-CM | POA: Diagnosis not present

## 2021-02-05 DIAGNOSIS — F1020 Alcohol dependence, uncomplicated: Secondary | ICD-10-CM | POA: Diagnosis not present

## 2021-02-06 DIAGNOSIS — F1120 Opioid dependence, uncomplicated: Secondary | ICD-10-CM | POA: Diagnosis not present

## 2021-02-06 DIAGNOSIS — F1020 Alcohol dependence, uncomplicated: Secondary | ICD-10-CM | POA: Diagnosis not present

## 2021-02-07 DIAGNOSIS — F1120 Opioid dependence, uncomplicated: Secondary | ICD-10-CM | POA: Diagnosis not present

## 2021-02-07 DIAGNOSIS — F1020 Alcohol dependence, uncomplicated: Secondary | ICD-10-CM | POA: Diagnosis not present

## 2021-02-08 DIAGNOSIS — F1020 Alcohol dependence, uncomplicated: Secondary | ICD-10-CM | POA: Diagnosis not present

## 2021-02-08 DIAGNOSIS — F1120 Opioid dependence, uncomplicated: Secondary | ICD-10-CM | POA: Diagnosis not present

## 2021-02-09 DIAGNOSIS — F1020 Alcohol dependence, uncomplicated: Secondary | ICD-10-CM | POA: Diagnosis not present

## 2021-02-09 DIAGNOSIS — F1120 Opioid dependence, uncomplicated: Secondary | ICD-10-CM | POA: Diagnosis not present

## 2021-02-10 DIAGNOSIS — F1120 Opioid dependence, uncomplicated: Secondary | ICD-10-CM | POA: Diagnosis not present

## 2021-02-10 DIAGNOSIS — F1020 Alcohol dependence, uncomplicated: Secondary | ICD-10-CM | POA: Diagnosis not present

## 2021-02-12 DIAGNOSIS — F1020 Alcohol dependence, uncomplicated: Secondary | ICD-10-CM | POA: Diagnosis not present

## 2021-02-12 DIAGNOSIS — F1120 Opioid dependence, uncomplicated: Secondary | ICD-10-CM | POA: Diagnosis not present

## 2021-02-13 DIAGNOSIS — F1020 Alcohol dependence, uncomplicated: Secondary | ICD-10-CM | POA: Diagnosis not present

## 2021-02-13 DIAGNOSIS — F1120 Opioid dependence, uncomplicated: Secondary | ICD-10-CM | POA: Diagnosis not present

## 2021-02-14 DIAGNOSIS — F1020 Alcohol dependence, uncomplicated: Secondary | ICD-10-CM | POA: Diagnosis not present

## 2021-02-14 DIAGNOSIS — F1120 Opioid dependence, uncomplicated: Secondary | ICD-10-CM | POA: Diagnosis not present

## 2021-02-15 DIAGNOSIS — F1120 Opioid dependence, uncomplicated: Secondary | ICD-10-CM | POA: Diagnosis not present

## 2021-02-15 DIAGNOSIS — F1020 Alcohol dependence, uncomplicated: Secondary | ICD-10-CM | POA: Diagnosis not present

## 2021-02-16 DIAGNOSIS — F1020 Alcohol dependence, uncomplicated: Secondary | ICD-10-CM | POA: Diagnosis not present

## 2021-02-16 DIAGNOSIS — F1120 Opioid dependence, uncomplicated: Secondary | ICD-10-CM | POA: Diagnosis not present

## 2021-02-17 DIAGNOSIS — F1020 Alcohol dependence, uncomplicated: Secondary | ICD-10-CM | POA: Diagnosis not present

## 2021-02-17 DIAGNOSIS — F1120 Opioid dependence, uncomplicated: Secondary | ICD-10-CM | POA: Diagnosis not present

## 2021-02-19 DIAGNOSIS — F1120 Opioid dependence, uncomplicated: Secondary | ICD-10-CM | POA: Diagnosis not present

## 2021-02-19 DIAGNOSIS — F1020 Alcohol dependence, uncomplicated: Secondary | ICD-10-CM | POA: Diagnosis not present

## 2021-02-20 DIAGNOSIS — F1020 Alcohol dependence, uncomplicated: Secondary | ICD-10-CM | POA: Diagnosis not present

## 2021-02-20 DIAGNOSIS — F1120 Opioid dependence, uncomplicated: Secondary | ICD-10-CM | POA: Diagnosis not present

## 2021-02-21 DIAGNOSIS — F1020 Alcohol dependence, uncomplicated: Secondary | ICD-10-CM | POA: Diagnosis not present

## 2021-02-21 DIAGNOSIS — F1120 Opioid dependence, uncomplicated: Secondary | ICD-10-CM | POA: Diagnosis not present

## 2021-02-22 DIAGNOSIS — F1120 Opioid dependence, uncomplicated: Secondary | ICD-10-CM | POA: Diagnosis not present

## 2021-02-22 DIAGNOSIS — F1020 Alcohol dependence, uncomplicated: Secondary | ICD-10-CM | POA: Diagnosis not present

## 2021-02-23 DIAGNOSIS — F1020 Alcohol dependence, uncomplicated: Secondary | ICD-10-CM | POA: Diagnosis not present

## 2021-02-23 DIAGNOSIS — F1120 Opioid dependence, uncomplicated: Secondary | ICD-10-CM | POA: Diagnosis not present

## 2021-02-24 DIAGNOSIS — F1120 Opioid dependence, uncomplicated: Secondary | ICD-10-CM | POA: Diagnosis not present

## 2021-02-24 DIAGNOSIS — F1020 Alcohol dependence, uncomplicated: Secondary | ICD-10-CM | POA: Diagnosis not present

## 2021-02-26 DIAGNOSIS — F1020 Alcohol dependence, uncomplicated: Secondary | ICD-10-CM | POA: Diagnosis not present

## 2021-02-26 DIAGNOSIS — F1120 Opioid dependence, uncomplicated: Secondary | ICD-10-CM | POA: Diagnosis not present

## 2021-02-27 DIAGNOSIS — F1120 Opioid dependence, uncomplicated: Secondary | ICD-10-CM | POA: Diagnosis not present

## 2021-02-27 DIAGNOSIS — F1020 Alcohol dependence, uncomplicated: Secondary | ICD-10-CM | POA: Diagnosis not present

## 2021-02-28 DIAGNOSIS — F1020 Alcohol dependence, uncomplicated: Secondary | ICD-10-CM | POA: Diagnosis not present

## 2021-02-28 DIAGNOSIS — F1120 Opioid dependence, uncomplicated: Secondary | ICD-10-CM | POA: Diagnosis not present

## 2021-03-01 DIAGNOSIS — F1020 Alcohol dependence, uncomplicated: Secondary | ICD-10-CM | POA: Diagnosis not present

## 2021-03-01 DIAGNOSIS — F1120 Opioid dependence, uncomplicated: Secondary | ICD-10-CM | POA: Diagnosis not present

## 2021-03-02 DIAGNOSIS — F1120 Opioid dependence, uncomplicated: Secondary | ICD-10-CM | POA: Diagnosis not present

## 2021-03-02 DIAGNOSIS — F1020 Alcohol dependence, uncomplicated: Secondary | ICD-10-CM | POA: Diagnosis not present

## 2021-03-03 DIAGNOSIS — F1120 Opioid dependence, uncomplicated: Secondary | ICD-10-CM | POA: Diagnosis not present

## 2021-03-03 DIAGNOSIS — F1020 Alcohol dependence, uncomplicated: Secondary | ICD-10-CM | POA: Diagnosis not present

## 2021-03-05 DIAGNOSIS — F1020 Alcohol dependence, uncomplicated: Secondary | ICD-10-CM | POA: Diagnosis not present

## 2021-03-05 DIAGNOSIS — F1120 Opioid dependence, uncomplicated: Secondary | ICD-10-CM | POA: Diagnosis not present

## 2021-03-06 DIAGNOSIS — F1120 Opioid dependence, uncomplicated: Secondary | ICD-10-CM | POA: Diagnosis not present

## 2021-03-06 DIAGNOSIS — F1020 Alcohol dependence, uncomplicated: Secondary | ICD-10-CM | POA: Diagnosis not present

## 2021-03-07 DIAGNOSIS — F1120 Opioid dependence, uncomplicated: Secondary | ICD-10-CM | POA: Diagnosis not present

## 2021-03-07 DIAGNOSIS — F1020 Alcohol dependence, uncomplicated: Secondary | ICD-10-CM | POA: Diagnosis not present

## 2021-03-08 DIAGNOSIS — F1120 Opioid dependence, uncomplicated: Secondary | ICD-10-CM | POA: Diagnosis not present

## 2021-03-08 DIAGNOSIS — F1020 Alcohol dependence, uncomplicated: Secondary | ICD-10-CM | POA: Diagnosis not present

## 2021-03-09 DIAGNOSIS — F1020 Alcohol dependence, uncomplicated: Secondary | ICD-10-CM | POA: Diagnosis not present

## 2021-03-09 DIAGNOSIS — F1120 Opioid dependence, uncomplicated: Secondary | ICD-10-CM | POA: Diagnosis not present

## 2021-03-10 DIAGNOSIS — F1120 Opioid dependence, uncomplicated: Secondary | ICD-10-CM | POA: Diagnosis not present

## 2021-03-10 DIAGNOSIS — F1020 Alcohol dependence, uncomplicated: Secondary | ICD-10-CM | POA: Diagnosis not present

## 2021-03-12 DIAGNOSIS — F1120 Opioid dependence, uncomplicated: Secondary | ICD-10-CM | POA: Diagnosis not present

## 2021-03-12 DIAGNOSIS — F1020 Alcohol dependence, uncomplicated: Secondary | ICD-10-CM | POA: Diagnosis not present

## 2021-03-13 DIAGNOSIS — F1120 Opioid dependence, uncomplicated: Secondary | ICD-10-CM | POA: Diagnosis not present

## 2021-03-13 DIAGNOSIS — F1020 Alcohol dependence, uncomplicated: Secondary | ICD-10-CM | POA: Diagnosis not present

## 2021-03-14 DIAGNOSIS — F1020 Alcohol dependence, uncomplicated: Secondary | ICD-10-CM | POA: Diagnosis not present

## 2021-03-14 DIAGNOSIS — F1120 Opioid dependence, uncomplicated: Secondary | ICD-10-CM | POA: Diagnosis not present

## 2021-03-15 DIAGNOSIS — F1120 Opioid dependence, uncomplicated: Secondary | ICD-10-CM | POA: Diagnosis not present

## 2021-03-15 DIAGNOSIS — F1020 Alcohol dependence, uncomplicated: Secondary | ICD-10-CM | POA: Diagnosis not present

## 2021-03-16 DIAGNOSIS — F1020 Alcohol dependence, uncomplicated: Secondary | ICD-10-CM | POA: Diagnosis not present

## 2021-03-16 DIAGNOSIS — F1120 Opioid dependence, uncomplicated: Secondary | ICD-10-CM | POA: Diagnosis not present

## 2021-03-17 DIAGNOSIS — F1120 Opioid dependence, uncomplicated: Secondary | ICD-10-CM | POA: Diagnosis not present

## 2021-03-17 DIAGNOSIS — F1020 Alcohol dependence, uncomplicated: Secondary | ICD-10-CM | POA: Diagnosis not present

## 2021-03-19 DIAGNOSIS — F1120 Opioid dependence, uncomplicated: Secondary | ICD-10-CM | POA: Diagnosis not present

## 2021-03-19 DIAGNOSIS — F1020 Alcohol dependence, uncomplicated: Secondary | ICD-10-CM | POA: Diagnosis not present

## 2021-03-20 DIAGNOSIS — N63 Unspecified lump in unspecified breast: Secondary | ICD-10-CM | POA: Diagnosis not present

## 2021-03-20 DIAGNOSIS — F1020 Alcohol dependence, uncomplicated: Secondary | ICD-10-CM | POA: Diagnosis not present

## 2021-03-20 DIAGNOSIS — F1120 Opioid dependence, uncomplicated: Secondary | ICD-10-CM | POA: Diagnosis not present

## 2021-03-20 DIAGNOSIS — R229 Localized swelling, mass and lump, unspecified: Secondary | ICD-10-CM | POA: Diagnosis not present

## 2021-03-21 DIAGNOSIS — F1020 Alcohol dependence, uncomplicated: Secondary | ICD-10-CM | POA: Diagnosis not present

## 2021-03-21 DIAGNOSIS — F1120 Opioid dependence, uncomplicated: Secondary | ICD-10-CM | POA: Diagnosis not present

## 2021-03-22 DIAGNOSIS — F1020 Alcohol dependence, uncomplicated: Secondary | ICD-10-CM | POA: Diagnosis not present

## 2021-03-22 DIAGNOSIS — F1120 Opioid dependence, uncomplicated: Secondary | ICD-10-CM | POA: Diagnosis not present

## 2021-03-23 DIAGNOSIS — F1120 Opioid dependence, uncomplicated: Secondary | ICD-10-CM | POA: Diagnosis not present

## 2021-03-23 DIAGNOSIS — F1020 Alcohol dependence, uncomplicated: Secondary | ICD-10-CM | POA: Diagnosis not present

## 2021-03-24 DIAGNOSIS — F1020 Alcohol dependence, uncomplicated: Secondary | ICD-10-CM | POA: Diagnosis not present

## 2021-03-24 DIAGNOSIS — F1120 Opioid dependence, uncomplicated: Secondary | ICD-10-CM | POA: Diagnosis not present

## 2021-03-26 DIAGNOSIS — F1020 Alcohol dependence, uncomplicated: Secondary | ICD-10-CM | POA: Diagnosis not present

## 2021-03-26 DIAGNOSIS — F1120 Opioid dependence, uncomplicated: Secondary | ICD-10-CM | POA: Diagnosis not present

## 2021-03-27 DIAGNOSIS — F1120 Opioid dependence, uncomplicated: Secondary | ICD-10-CM | POA: Diagnosis not present

## 2021-03-27 DIAGNOSIS — F1020 Alcohol dependence, uncomplicated: Secondary | ICD-10-CM | POA: Diagnosis not present

## 2021-03-30 DIAGNOSIS — F1020 Alcohol dependence, uncomplicated: Secondary | ICD-10-CM | POA: Diagnosis not present

## 2021-03-30 DIAGNOSIS — F1120 Opioid dependence, uncomplicated: Secondary | ICD-10-CM | POA: Diagnosis not present

## 2021-03-31 DIAGNOSIS — F1120 Opioid dependence, uncomplicated: Secondary | ICD-10-CM | POA: Diagnosis not present

## 2021-03-31 DIAGNOSIS — F1020 Alcohol dependence, uncomplicated: Secondary | ICD-10-CM | POA: Diagnosis not present

## 2021-04-02 DIAGNOSIS — F1120 Opioid dependence, uncomplicated: Secondary | ICD-10-CM | POA: Diagnosis not present

## 2021-04-02 DIAGNOSIS — F1020 Alcohol dependence, uncomplicated: Secondary | ICD-10-CM | POA: Diagnosis not present

## 2021-04-03 DIAGNOSIS — F1020 Alcohol dependence, uncomplicated: Secondary | ICD-10-CM | POA: Diagnosis not present

## 2021-04-03 DIAGNOSIS — F1120 Opioid dependence, uncomplicated: Secondary | ICD-10-CM | POA: Diagnosis not present

## 2021-04-04 DIAGNOSIS — F1120 Opioid dependence, uncomplicated: Secondary | ICD-10-CM | POA: Diagnosis not present

## 2021-04-04 DIAGNOSIS — F1020 Alcohol dependence, uncomplicated: Secondary | ICD-10-CM | POA: Diagnosis not present

## 2021-04-05 DIAGNOSIS — F1020 Alcohol dependence, uncomplicated: Secondary | ICD-10-CM | POA: Diagnosis not present

## 2021-04-05 DIAGNOSIS — F1120 Opioid dependence, uncomplicated: Secondary | ICD-10-CM | POA: Diagnosis not present

## 2021-04-06 DIAGNOSIS — F1120 Opioid dependence, uncomplicated: Secondary | ICD-10-CM | POA: Diagnosis not present

## 2021-04-06 DIAGNOSIS — F1020 Alcohol dependence, uncomplicated: Secondary | ICD-10-CM | POA: Diagnosis not present

## 2021-04-07 DIAGNOSIS — F1020 Alcohol dependence, uncomplicated: Secondary | ICD-10-CM | POA: Diagnosis not present

## 2021-04-07 DIAGNOSIS — F1120 Opioid dependence, uncomplicated: Secondary | ICD-10-CM | POA: Diagnosis not present

## 2021-04-10 DIAGNOSIS — F1120 Opioid dependence, uncomplicated: Secondary | ICD-10-CM | POA: Diagnosis not present

## 2021-04-10 DIAGNOSIS — F1020 Alcohol dependence, uncomplicated: Secondary | ICD-10-CM | POA: Diagnosis not present

## 2021-04-11 DIAGNOSIS — F1020 Alcohol dependence, uncomplicated: Secondary | ICD-10-CM | POA: Diagnosis not present

## 2021-04-11 DIAGNOSIS — F1120 Opioid dependence, uncomplicated: Secondary | ICD-10-CM | POA: Diagnosis not present

## 2021-04-12 DIAGNOSIS — F1120 Opioid dependence, uncomplicated: Secondary | ICD-10-CM | POA: Diagnosis not present

## 2021-04-12 DIAGNOSIS — F1020 Alcohol dependence, uncomplicated: Secondary | ICD-10-CM | POA: Diagnosis not present

## 2021-04-13 DIAGNOSIS — F1120 Opioid dependence, uncomplicated: Secondary | ICD-10-CM | POA: Diagnosis not present

## 2021-04-13 DIAGNOSIS — F1020 Alcohol dependence, uncomplicated: Secondary | ICD-10-CM | POA: Diagnosis not present

## 2021-04-14 DIAGNOSIS — F1020 Alcohol dependence, uncomplicated: Secondary | ICD-10-CM | POA: Diagnosis not present

## 2021-04-14 DIAGNOSIS — F1120 Opioid dependence, uncomplicated: Secondary | ICD-10-CM | POA: Diagnosis not present

## 2021-04-16 DIAGNOSIS — F1020 Alcohol dependence, uncomplicated: Secondary | ICD-10-CM | POA: Diagnosis not present

## 2021-04-16 DIAGNOSIS — F1120 Opioid dependence, uncomplicated: Secondary | ICD-10-CM | POA: Diagnosis not present

## 2021-04-17 DIAGNOSIS — F1120 Opioid dependence, uncomplicated: Secondary | ICD-10-CM | POA: Diagnosis not present

## 2021-04-17 DIAGNOSIS — F1020 Alcohol dependence, uncomplicated: Secondary | ICD-10-CM | POA: Diagnosis not present

## 2021-04-18 DIAGNOSIS — F1120 Opioid dependence, uncomplicated: Secondary | ICD-10-CM | POA: Diagnosis not present

## 2021-04-18 DIAGNOSIS — F1020 Alcohol dependence, uncomplicated: Secondary | ICD-10-CM | POA: Diagnosis not present

## 2021-04-19 DIAGNOSIS — F1120 Opioid dependence, uncomplicated: Secondary | ICD-10-CM | POA: Diagnosis not present

## 2021-04-19 DIAGNOSIS — F1020 Alcohol dependence, uncomplicated: Secondary | ICD-10-CM | POA: Diagnosis not present

## 2021-04-20 DIAGNOSIS — F1020 Alcohol dependence, uncomplicated: Secondary | ICD-10-CM | POA: Diagnosis not present

## 2021-04-20 DIAGNOSIS — F1120 Opioid dependence, uncomplicated: Secondary | ICD-10-CM | POA: Diagnosis not present

## 2021-04-21 DIAGNOSIS — F1120 Opioid dependence, uncomplicated: Secondary | ICD-10-CM | POA: Diagnosis not present

## 2021-04-21 DIAGNOSIS — F1020 Alcohol dependence, uncomplicated: Secondary | ICD-10-CM | POA: Diagnosis not present

## 2021-04-23 DIAGNOSIS — F1120 Opioid dependence, uncomplicated: Secondary | ICD-10-CM | POA: Diagnosis not present

## 2021-04-23 DIAGNOSIS — F1020 Alcohol dependence, uncomplicated: Secondary | ICD-10-CM | POA: Diagnosis not present

## 2021-04-25 DIAGNOSIS — F1120 Opioid dependence, uncomplicated: Secondary | ICD-10-CM | POA: Diagnosis not present

## 2021-04-25 DIAGNOSIS — F1020 Alcohol dependence, uncomplicated: Secondary | ICD-10-CM | POA: Diagnosis not present

## 2021-04-27 DIAGNOSIS — F1020 Alcohol dependence, uncomplicated: Secondary | ICD-10-CM | POA: Diagnosis not present

## 2021-04-27 DIAGNOSIS — F1120 Opioid dependence, uncomplicated: Secondary | ICD-10-CM | POA: Diagnosis not present

## 2021-04-30 DIAGNOSIS — F1020 Alcohol dependence, uncomplicated: Secondary | ICD-10-CM | POA: Diagnosis not present

## 2021-04-30 DIAGNOSIS — F1120 Opioid dependence, uncomplicated: Secondary | ICD-10-CM | POA: Diagnosis not present

## 2021-05-01 DIAGNOSIS — Z4802 Encounter for removal of sutures: Secondary | ICD-10-CM | POA: Diagnosis not present

## 2021-05-05 DIAGNOSIS — F1120 Opioid dependence, uncomplicated: Secondary | ICD-10-CM | POA: Diagnosis not present

## 2021-05-05 DIAGNOSIS — F1020 Alcohol dependence, uncomplicated: Secondary | ICD-10-CM | POA: Diagnosis not present
